# Patient Record
Sex: Male | Born: 1970 | Hispanic: Yes | Marital: Single | Smoking: Current every day smoker
Health system: Southern US, Community
[De-identification: ages and names within clinical notes are randomized; demographics above are authoritative.]

## PROBLEM LIST (undated history)

## (undated) DIAGNOSIS — F1010 Alcohol abuse, uncomplicated: Secondary | ICD-10-CM

---

## 2013-01-21 ENCOUNTER — Emergency Department (HOSPITAL_COMMUNITY): Payer: Self-pay

## 2013-01-21 ENCOUNTER — Emergency Department (HOSPITAL_COMMUNITY)
Admission: EM | Admit: 2013-01-21 | Discharge: 2013-01-22 | Disposition: A | Discharge status: Home / Self Care | Payer: Self-pay | Attending: Emergency Medicine | Admitting: Emergency Medicine

## 2013-01-21 ENCOUNTER — Encounter (HOSPITAL_COMMUNITY): Payer: Self-pay | Admitting: Emergency Medicine

## 2013-01-21 DIAGNOSIS — F1010 Alcohol abuse, uncomplicated: Secondary | ICD-10-CM | POA: Insufficient documentation

## 2013-01-21 DIAGNOSIS — R0602 Shortness of breath: Secondary | ICD-10-CM | POA: Insufficient documentation

## 2013-01-21 DIAGNOSIS — R112 Nausea with vomiting, unspecified: Secondary | ICD-10-CM | POA: Insufficient documentation

## 2013-01-21 DIAGNOSIS — K2990 Gastroduodenitis, unspecified, without bleeding: Secondary | ICD-10-CM | POA: Insufficient documentation

## 2013-01-21 DIAGNOSIS — K2970 Gastritis, unspecified, without bleeding: Secondary | ICD-10-CM | POA: Insufficient documentation

## 2013-01-21 DIAGNOSIS — R109 Unspecified abdominal pain: Secondary | ICD-10-CM | POA: Insufficient documentation

## 2013-01-21 HISTORY — DX: Alcohol abuse, uncomplicated: F10.10

## 2013-01-21 LAB — CBC WITH DIFFERENTIAL/PLATELET
Basophils Absolute: 0.1 10*3/uL (ref 0.0–0.1)
Basophils Relative: 1 % (ref 0–1)
Eosinophils Absolute: 0 10*3/uL (ref 0.0–0.7)
Hemoglobin: 17.1 g/dL — ABNORMAL HIGH (ref 13.0–17.0)
MCH: 28.8 pg (ref 26.0–34.0)
MCHC: 35.9 g/dL (ref 30.0–36.0)
Monocytes Relative: 7 % (ref 3–12)
Neutrophils Relative %: 64 % (ref 43–77)
Platelets: 216 10*3/uL (ref 150–400)
RDW: 13.5 % (ref 11.5–15.5)

## 2013-01-21 LAB — COMPREHENSIVE METABOLIC PANEL
Albumin: 3.7 g/dL (ref 3.5–5.2)
Alkaline Phosphatase: 70 U/L (ref 39–117)
BUN: 15 mg/dL (ref 6–23)
Creatinine, Ser: 0.89 mg/dL (ref 0.50–1.35)
Potassium: 4 mEq/L (ref 3.5–5.1)
Total Protein: 7.3 g/dL (ref 6.0–8.3)

## 2013-01-21 LAB — LIPASE, BLOOD: Lipase: 23 U/L (ref 11–59)

## 2013-01-21 LAB — RAPID URINE DRUG SCREEN, HOSP PERFORMED
Benzodiazepines: NOT DETECTED
Cocaine: NOT DETECTED

## 2013-01-21 LAB — ETHANOL: Alcohol, Ethyl (B): 192 mg/dL — ABNORMAL HIGH (ref 0–11)

## 2013-01-21 MED ORDER — FOLIC ACID 1 MG PO TABS
1.0000 mg | ORAL_TABLET | Freq: Every day | ORAL | Status: DC
  Administered 2013-01-21 – 2013-01-22 (×2): 1 mg via ORAL
  Filled 2013-01-21 (×2): qty 1

## 2013-01-21 MED ORDER — VITAMIN B-1 100 MG PO TABS
100.0000 mg | ORAL_TABLET | Freq: Every day | ORAL | Status: DC
  Administered 2013-01-22: 100 mg via ORAL
  Filled 2013-01-21: qty 1

## 2013-01-21 MED ORDER — ONDANSETRON 4 MG PO TBDP
8.0000 mg | ORAL_TABLET | Freq: Once | ORAL | Status: AC
  Administered 2013-01-21: 8 mg via ORAL
  Filled 2013-01-21: qty 2

## 2013-01-21 MED ORDER — LORAZEPAM 1 MG PO TABS
1.0000 mg | ORAL_TABLET | Freq: Once | ORAL | Status: AC
  Administered 2013-01-21: 1 mg via ORAL
  Filled 2013-01-21: qty 1

## 2013-01-21 MED ORDER — ONDANSETRON HCL 4 MG PO TABS
4.0000 mg | ORAL_TABLET | Freq: Three times a day (TID) | ORAL | Status: DC | PRN
  Administered 2013-01-22: 4 mg via ORAL
  Filled 2013-01-21: qty 1

## 2013-01-21 MED ORDER — LORAZEPAM 2 MG/ML IJ SOLN: 1.0000 mg | Freq: Four times a day (QID) | INTRAMUSCULAR | Status: DC | PRN

## 2013-01-21 MED ORDER — ADULT MULTIVITAMIN W/MINERALS CH
1.0000 | ORAL_TABLET | Freq: Every day | ORAL | Status: DC
  Administered 2013-01-22: 1 via ORAL
  Filled 2013-01-21: qty 1

## 2013-01-21 MED ORDER — ZOLPIDEM TARTRATE 5 MG PO TABS
5.0000 mg | ORAL_TABLET | Freq: Every evening | ORAL | Status: DC | PRN
  Administered 2013-01-21: 5 mg via ORAL
  Filled 2013-01-21: qty 1

## 2013-01-21 MED ORDER — LORAZEPAM 1 MG PO TABS: 0.0000 mg | ORAL_TABLET | Freq: Two times a day (BID) | ORAL | Status: DC

## 2013-01-21 MED ORDER — IBUPROFEN 600 MG PO TABS: 600.0000 mg | ORAL_TABLET | Freq: Three times a day (TID) | ORAL | Status: DC | PRN

## 2013-01-21 MED ORDER — LORAZEPAM 1 MG PO TABS
0.0000 mg | ORAL_TABLET | Freq: Four times a day (QID) | ORAL | Status: DC
  Administered 2013-01-21: 2 mg via ORAL
  Filled 2013-01-21: qty 1
  Filled 2013-01-21: qty 2

## 2013-01-21 MED ORDER — THIAMINE HCL 100 MG/ML IJ SOLN: 100.0000 mg | Freq: Every day | INTRAMUSCULAR | Status: DC

## 2013-01-21 MED ORDER — LORAZEPAM 1 MG PO TABS
1.0000 mg | ORAL_TABLET | Freq: Four times a day (QID) | ORAL | Status: DC | PRN
  Administered 2013-01-22: 1 mg via ORAL

## 2013-01-21 NOTE — ED Notes (Signed)
Pt received from the ED 1715. Meds scheduled for 1530 were not given in main ED. Reason unknown.

## 2013-01-21 NOTE — ED Provider Notes (Signed)
History    This chart was scribed for non-physician practitioner Lottie Mussel, PA-C working with Richardean Canal, MD by Toya Smothers, ED Scribe. This patient was seen in room [facility]TR1/[facility]PT1 and the patient's care was started at 4:12 PM.   CSN: [id number]  Arrival date & time 01/21/13  1426   First MD Initiated Contact with Patient 01/21/13 1453      Chief Complaint  Patient presents with  . Medical Clearance    The history is provided by the patient. A language interpreter was used (Spanish interpreter).    HPI Comments:  Willie Boyle is a 42 y.o. male with a h/o alcohol abuse, who presents to the Emergency Department for medical clearance, stating that he wants to enrolled a detox program. He reports a h/o social drinking, though for the past week he has had increased use. Pt states that he drinks liquor and wine, typically one bottle a day. Pt last consumed 2 glasses of wine this morning. He reports no prior treatment or hospitalization. Pt now c/o unchanged generalized lower abdominal pain, mild SOB, and emesis this morning. Symptoms have not been treated PTA. Pt denies headache, diaphoresis, fever, chills, nausea, vomiting, diarrhea, weakness, cough, SOB and any other pain. Pt denies use of tobacco and illicit drug use.   Past Medical History  Diagnosis Date  . Alcohol abuse     History reviewed. No pertinent past surgical history.  No family history on file.  History  Substance Use Topics  . Smoking status: Not on file  . Smokeless tobacco: Not on file  . Alcohol Use: Not on file      Review of Systems  Constitutional: Negative for fever and chills.  HENT: Negative for rhinorrhea and neck pain.   Eyes: Negative for pain.  Respiratory: Positive for shortness of breath. Negative for cough.   Cardiovascular: Negative for chest pain.  Gastrointestinal: Positive for nausea, vomiting and abdominal pain. Negative for diarrhea.  Genitourinary: Negative for dysuria.   Musculoskeletal: Negative for back pain.  Skin: Negative for rash.  Neurological: Negative for dizziness and weakness.    Allergies  Review of patient's allergies indicates no known allergies.  Home Medications  No current outpatient prescriptions on file.  BP 130/90  Pulse 106  Temp(Src) 99.8 F (37.7 C) (Oral)  Resp 18  SpO2 97%  Physical Exam  Nursing note and vitals reviewed. Constitutional: He is oriented to person, place, and time. He appears well-developed and well-nourished. No distress.  HENT:  Head: Normocephalic and atraumatic.  Eyes: EOM are normal. Pupils are equal, round, and reactive to light.  Neck: Neck supple. No tracheal deviation present.  Cardiovascular: Normal rate, regular rhythm and normal heart sounds.  Exam reveals no gallop and no friction rub.   No murmur heard. Pulmonary/Chest: Effort normal. No respiratory distress.  Abdominal: Soft. He exhibits no distension.  Diffusely tender  Musculoskeletal: Normal range of motion. He exhibits no edema.  Neurological: He is alert and oriented to person, place, and time. No sensory deficit.  Skin: Skin is warm and dry.  Psychiatric: He has a normal mood and affect. His behavior is normal.    ED Course  Procedures DIAGNOSTIC STUDIES: Oxygen Saturation is 97% on room air, normal by my interpretation.    COORDINATION OF CARE: 15:16- Evaluated Pt. Pt is awake, alert, and without distress. 15:22- Patient understand and agree with initial ED impression and plan with expectations set for ED visit. 15:28- Ordered DG Chest 2 View 1  time imaging and Drug screen panel, emergency STAT.    Results for orders placed during the hospital encounter of 01/21/13  CBC WITH DIFFERENTIAL      Result Value Range   WBC 4.4  4.0 - 10.5 K/uL   RBC 5.93 (*) 4.22 - 5.81 MIL/uL   Hemoglobin 17.1 (*) 13.0 - 17.0 g/dL   HCT 78.2  95.6 - 21.3 %   MCV 80.3  78.0 - 100.0 fL   MCH 28.8  26.0 - 34.0 pg   MCHC 35.9  30.0 - 36.0  g/dL   RDW 08.6  57.8 - 46.9 %   Platelets 216  150 - 400 K/uL   Neutrophils Relative % 64  43 - 77 %   Neutro Abs 2.8  1.7 - 7.7 K/uL   Lymphocytes Relative 28  12 - 46 %   Lymphs Abs 1.2  0.7 - 4.0 K/uL   Monocytes Relative 7  3 - 12 %   Monocytes Absolute 0.3  0.1 - 1.0 K/uL   Eosinophils Relative 0  0 - 5 %   Eosinophils Absolute 0.0  0.0 - 0.7 K/uL   Basophils Relative 1  0 - 1 %   Basophils Absolute 0.1  0.0 - 0.1 K/uL  COMPREHENSIVE METABOLIC PANEL      Result Value Range   Sodium 137  135 - 145 mEq/L   Potassium 4.0  3.5 - 5.1 mEq/L   Chloride 99  96 - 112 mEq/L   CO2 24  19 - 32 mEq/L   Glucose, Bld 117 (*) 70 - 99 mg/dL   BUN 15  6 - 23 mg/dL   Creatinine, Ser 6.29  0.50 - 1.35 mg/dL   Calcium 8.7  8.4 - 52.8 mg/dL   Total Protein 7.3  6.0 - 8.3 g/dL   Albumin 3.7  3.5 - 5.2 g/dL   AST 27  0 - 37 U/L   ALT 22  0 - 53 U/L   Alkaline Phosphatase 70  39 - 117 U/L   Total Bilirubin 0.9  0.3 - 1.2 mg/dL   GFR calc non Af Amer >90  >90 mL/min   GFR calc Af Amer >90  >90 mL/min  LIPASE, BLOOD      Result Value Range   Lipase 23  11 - 59 U/L  ETHANOL      Result Value Range   Alcohol, Ethyl (B) 192 (*) 0 - 11 mg/dL  URINE RAPID DRUG SCREEN (HOSP PERFORMED)      Result Value Range   Opiates NONE DETECTED  NONE DETECTED   Cocaine NONE DETECTED  NONE DETECTED   Benzodiazepines NONE DETECTED  NONE DETECTED   Amphetamines NONE DETECTED  NONE DETECTED   Tetrahydrocannabinol NONE DETECTED  NONE DETECTED   Barbiturates NONE DETECTED  NONE DETECTED  POCT I-STAT TROPONIN I      Result Value Range   Troponin i, poc 0.01  0.00 - 0.08 ng/mL   Comment 3            Dg Chest 2 View  01/21/2013   *RADIOLOGY REPORT*  Clinical Data: For medical clearance  CHEST - 2 VIEW  Comparison: None.  Findings: No active infiltrate or effusion is seen.  Mediastinal contours appear normal.  The heart is within normal limits in size. No bony abnormality is seen.  IMPRESSION: No active lung  disease.   Original Report Authenticated By: Dwyane Dee, M.D.      1. Alcohol abuse  2. Nausea & vomiting   3. Gastritis       MDM  Pt with alcohol abuse, requesting inpatient detox. Also here with abdominal discomfort, nausea, vomiting at home. Abdomen is soft, no acute abdomen on exam. Labs unremarkable. Tolerating PO fluids in ED. Pt has no known medical problems. VS normal in ED. Will place on CIWA protocol and spoke with ACT, they will assess.   Filed Vitals:   01/21/13 1430 01/21/13 1702 01/21/13 1756  BP: 130/90 137/92 145/85  Pulse: 106 90 64  Temp: 99.8 F (37.7 C) 99.6 F (37.6 C) 98.2 F (36.8 C)  TempSrc: Oral Oral Oral  Resp: 18  20  SpO2: 97% 98% 99%     I personally performed the services described in this documentation, which was scribed in my presence. The recorded information has been reviewed and is accurate.    Lottie Mussel, PA-C 01/22/13 0124

## 2013-01-21 NOTE — ED Notes (Signed)
Pt brought to the Psych ED. States that he has been drinking for 25 years. States that he drinks beer but could not determine how much due to a language barrier. States he is feeling nervous. Pt is homeless. Originally from [place].

## 2013-01-21 NOTE — ED Notes (Signed)
Per EMS pt called and reports he is homeless and reports he has been an alcoholic for years. Denies drug use. Wants Detox. Pt positive for N/V. Pt voluntary and cooperative. Pt speaks spanish.

## 2013-01-21 NOTE — ED Notes (Signed)
After calling the interpertation line pt states that he drinks liquor approx 1 bottle a day. C/o n/v and abd pain. Not very clear in what he needs did states that he wants to have detox.

## 2013-01-22 NOTE — BH Assessment (Signed)
Assessment Note   Willie Boyle is an 42 y.o. male.   Pt presents in ED with request for alcohol detox or optx.  Pt consumes 12pk or more for 5 day binges every 2 wks or so.  Pt could not identify rational for use except "this is what I have always done."  Pt denied SI, HI, AVH.  Pt denied being on medications or past tx history.  Pt is spanish speaking and interpretor services were used.    MSE by ACT:  Pt appeared dishelved, good eye contact, Ox3, speech clear, pt was rubbing belly region bu when asked pt responded "I have the hiccups."  Pt facial experessions were in appropriate range.  Prior to concluding, Assessor asked interpretor is she picked up on any issues needing to be addressed that Assessor did not due to language barrier and she responded "No.  His answers were in line with appropriate responses."    Pt decided in the assessment he wanted optx due to not being able to miss work on Monday.  Pt appear ambilvent about inpatient detox.  Assessor explained the benefits of inpatient detox and pt opted for optx.  Pt denied having seizure hx, and did not appear to be going through any withdraws at this time.  Pt demonstrated steady gait and was up walking around when Assessor left his room.  Pt went to the bathroom on his own.  Will refer to [facility], [facility] and [facility] for follow up.  Axis I: Alcohol Abuse Axis II: Deferred Axis III:  Past Medical History  Diagnosis Date  . Alcohol abuse    Axis IV: other psychosocial or environmental problems and problems related to social environment Axis V: 51-60 moderate symptoms  Past Medical History:  Past Medical History  Diagnosis Date  . Alcohol abuse     History reviewed. No pertinent past surgical history.  Family History: No family history on file.  Social History:  has no tobacco, alcohol, and drug history on file.  Additional Social History:  Alcohol / Drug Use Pain Medications: na Prescriptions: na Over the  Counter: na History of alcohol / drug use?: Yes Longest period of sobriety (when/how long): 1 yr Negative Consequences of Use: Financial;Personal relationships Substance #1 Name of Substance 1: alcohol 1 - Age of First Use: teen 1 - Amount (size/oz): 12pk to case - depends 1 - Frequency: daily 1 - Duration: 5 to 8 days binge; 2 wks off; cycle repeats 1 - Last Use / Amount: 01-22-13  CIWA: CIWA-Ar BP: 138/93 mmHg Pulse Rate: 81 Nausea and Vomiting: mild nausea with no vomiting Tactile Disturbances: none Tremor: not visible, but can be felt fingertip to fingertip Auditory Disturbances: very mild harshness or ability to frighten Paroxysmal Sweats: barely perceptible sweating, palms moist Visual Disturbances: not present Anxiety: two Headache, Fullness in Head: none present Agitation: normal activity Orientation and Clouding of Sensorium: oriented and can do serial additions CIWA-Ar Total: 6 COWS:    Allergies: No Known Allergies  Home Medications:  (Not in a hospital admission)  OB/GYN Status:  No LMP for male patient.  General Assessment Data Location of Assessment: [facility] Living Arrangements: Other (Comment) (lives in [place] with others ) Can pt return to current living arrangement?: Yes Admission Status: Voluntary Is patient capable of signing voluntary admission?: Yes Transfer from: Acute Hospital Referral Source: MD  Education Status Is patient currently in school?: No  Risk to self Suicidal Ideation: No Suicidal Intent: No Is patient at risk  for suicide?: No Suicidal Plan?: No Access to Means: No What has been your use of drugs/alcohol within the last 12 months?: na Previous Attempts/Gestures: No How many times?: 0 Other Self Harm Risks: na Triggers for Past Attempts: None known Intentional Self Injurious Behavior: None Family Suicide History: No Recent stressful life event(s):  (sa issues) Persecutory voices/beliefs?: No Depression: No Substance abuse  history and/or treatment for substance abuse?: Yes (Has never had treatment. Does not smoke or use legal drugs) Suicide prevention information given to non-admitted patients: Yes  Risk to Others Homicidal Ideation: No-Not Currently/Within Last 6 Months Thoughts of Harm to Others: No Current Homicidal Intent: No Current Homicidal Plan: No Access to Homicidal Means: No Identified Victim: na History of harm to others?: No Assessment of Violence: None Noted Violent Behavior Description: cooperative Does patient have access to weapons?: No Criminal Charges Pending?: No Does patient have a court date: No  Psychosis Hallucinations: None noted Delusions: None noted  Mental Status Report Appear/Hygiene: Disheveled Eye Contact: Good Motor Activity: Freedom of movement Speech: Logical/coherent Level of Consciousness: Alert Mood: Ambivalent (unsure if he wanted detox or optx) Affect: Appropriate to circumstance Anxiety Level: None Thought Processes: Coherent Judgement: Unimpaired Orientation: Person;Place;Situation;Appropriate for developmental age Obsessive Compulsive Thoughts/Behaviors: None  Cognitive Functioning Concentration: Normal Memory: Recent Intact;Remote Intact IQ: Average Insight: Fair Impulse Control: Poor Appetite: Fair Weight Loss: 0 Weight Gain: 0 Sleep: No Change Total Hours of Sleep: 6 Vegetative Symptoms: None  ADLScreening [facility] Assessment Services) Patient's cognitive ability adequate to safely complete daily activities?: Yes Patient able to express need for assistance with ADLs?: Yes Independently performs ADLs?: Yes (appropriate for developmental age)  Abuse/Neglect [facility]) Physical Abuse: Denies Verbal Abuse: Denies Sexual Abuse: Denies  Prior Inpatient Therapy Prior Inpatient Therapy: No Prior Therapy Dates: na Prior Therapy Facilty/Provider(s): na Reason for Treatment: na  Prior Outpatient Therapy Prior Outpatient Therapy: No Prior Therapy  Dates: na Prior Therapy Facilty/Provider(s): na Reason for Treatment: na  ADL Screening (condition at time of admission) Patient's cognitive ability adequate to safely complete daily activities?: Yes Patient able to express need for assistance with ADLs?: Yes Independently performs ADLs?: Yes (appropriate for developmental age) Weakness of Legs: None Weakness of Arms/Hands: None  Home Assistive Devices/Equipment Home Assistive Devices/Equipment: None  Therapy Consults (therapy consults require a physician order) PT Evaluation Needed: No OT Evalulation Needed: No SLP Evaluation Needed: No Abuse/Neglect Assessment (Assessment to be complete while patient is alone) Physical Abuse: Denies Verbal Abuse: Denies Sexual Abuse: Denies Exploitation of patient/patient's resources: Denies Self-Neglect: Denies Values / Beliefs Cultural Requests During Hospitalization: None Spiritual Requests During Hospitalization: None Consults Spiritual Care Consult Needed: No Social Work Consult Needed: No Merchant navy officer (For Healthcare) Advance Directive: Patient does not have advance directive Pre-existing out of facility DNR order (yellow form or pink MOST form): No    Additional Information 1:1 In Past 12 Months?: No CIRT Risk: No Elopement Risk: No Does patient have medical clearance?: Yes     Disposition:   Disposition Initial Assessment Completed for this Encounter: Yes Disposition of Patient: Referred to;Outpatient treatment ([facility]; [facility] services and [facility]) Type of outpatient treatment: [facility] - Intensive Outpatient;Adult Patient referred to: Outpatient clinic referral ([facility]; [facility])  On Site Evaluation by:   Reviewed with Physician:     Macon Large 01/22/2013 10:05 AM

## 2013-01-22 NOTE — ED Notes (Signed)
Assessment completed with help of interpreter.

## 2013-01-22 NOTE — ED Notes (Signed)
D/C instructions given along with a bus pass. Escorted with mental health tech to front of hospital. Belongings returned. Ambulatory without difficulty, no complaint of pain.

## 2013-01-22 NOTE — ED Provider Notes (Signed)
Pt stable awaiting placement  Willie Boyle L [name], MD 01/22/13 0723 

## 2013-01-22 NOTE — ED Provider Notes (Signed)
Medical screening examination/treatment/procedure(s) were performed by non-physician practitioner and as supervising physician I was immediately available for consultation/collaboration.   Nikelle Malatesta H Kynsleigh Westendorf, MD 01/22/13 1511 

## 2013-11-09 ENCOUNTER — Emergency Department (HOSPITAL_COMMUNITY)
Admission: EM | Admit: 2013-11-09 | Discharge: 2013-11-09 | Disposition: A | Discharge status: Other Acute Inpatient Hospital | Payer: Self-pay | Attending: Emergency Medicine | Admitting: Emergency Medicine

## 2013-11-09 ENCOUNTER — Encounter (HOSPITAL_COMMUNITY): Payer: Self-pay | Admitting: Emergency Medicine

## 2013-11-09 ENCOUNTER — Emergency Department (HOSPITAL_COMMUNITY): Payer: Self-pay

## 2013-11-09 ENCOUNTER — Inpatient Hospital Stay (HOSPITAL_COMMUNITY): Admission: AD | Admit: 2013-11-09 | Payer: Self-pay | Source: Intra-hospital | Admitting: Psychiatry

## 2013-11-09 DIAGNOSIS — F1010 Alcohol abuse, uncomplicated: Secondary | ICD-10-CM | POA: Diagnosis present

## 2013-11-09 DIAGNOSIS — R1013 Epigastric pain: Secondary | ICD-10-CM | POA: Insufficient documentation

## 2013-11-09 DIAGNOSIS — R197 Diarrhea, unspecified: Secondary | ICD-10-CM | POA: Insufficient documentation

## 2013-11-09 DIAGNOSIS — F10239 Alcohol dependence with withdrawal, unspecified: Secondary | ICD-10-CM | POA: Diagnosis present

## 2013-11-09 DIAGNOSIS — R109 Unspecified abdominal pain: Secondary | ICD-10-CM

## 2013-11-09 DIAGNOSIS — F17200 Nicotine dependence, unspecified, uncomplicated: Secondary | ICD-10-CM | POA: Insufficient documentation

## 2013-11-09 DIAGNOSIS — F10939 Alcohol use, unspecified with withdrawal, unspecified: Secondary | ICD-10-CM

## 2013-11-09 DIAGNOSIS — R112 Nausea with vomiting, unspecified: Secondary | ICD-10-CM | POA: Insufficient documentation

## 2013-11-09 LAB — CBC WITH DIFFERENTIAL/PLATELET
BASOS ABS: 0.1 10*3/uL (ref 0.0–0.1)
BASOS PCT: 1 % (ref 0–1)
EOS PCT: 0 % (ref 0–5)
Eosinophils Absolute: 0 10*3/uL (ref 0.0–0.7)
HEMATOCRIT: 45.1 % (ref 39.0–52.0)
Hemoglobin: 16.3 g/dL (ref 13.0–17.0)
Lymphocytes Relative: 27 % (ref 12–46)
Lymphs Abs: 1.6 10*3/uL (ref 0.7–4.0)
MCH: 28.9 pg (ref 26.0–34.0)
MCHC: 36.1 g/dL — AB (ref 30.0–36.0)
MCV: 80 fL (ref 78.0–100.0)
MONO ABS: 0.4 10*3/uL (ref 0.1–1.0)
Monocytes Relative: 7 % (ref 3–12)
Neutro Abs: 3.9 10*3/uL (ref 1.7–7.7)
Neutrophils Relative %: 66 % (ref 43–77)
Platelets: 222 10*3/uL (ref 150–400)
RBC: 5.64 MIL/uL (ref 4.22–5.81)
RDW: 13.3 % (ref 11.5–15.5)
WBC: 5.9 10*3/uL (ref 4.0–10.5)

## 2013-11-09 LAB — URINALYSIS, ROUTINE W REFLEX MICROSCOPIC
BILIRUBIN URINE: NEGATIVE
Glucose, UA: NEGATIVE mg/dL
Hgb urine dipstick: NEGATIVE
Ketones, ur: NEGATIVE mg/dL
Leukocytes, UA: NEGATIVE
NITRITE: NEGATIVE
PROTEIN: NEGATIVE mg/dL
SPECIFIC GRAVITY, URINE: 1.011 (ref 1.005–1.030)
UROBILINOGEN UA: 0.2 mg/dL (ref 0.0–1.0)
pH: 7.5 (ref 5.0–8.0)

## 2013-11-09 LAB — RAPID URINE DRUG SCREEN, HOSP PERFORMED
AMPHETAMINES: NOT DETECTED
BARBITURATES: NOT DETECTED
BENZODIAZEPINES: NOT DETECTED
Cocaine: NOT DETECTED
Opiates: NOT DETECTED
TETRAHYDROCANNABINOL: NOT DETECTED

## 2013-11-09 LAB — COMPREHENSIVE METABOLIC PANEL
ALBUMIN: 3.7 g/dL (ref 3.5–5.2)
ALT: 22 U/L (ref 0–53)
AST: 42 U/L — AB (ref 0–37)
Alkaline Phosphatase: 70 U/L (ref 39–117)
BUN: 13 mg/dL (ref 6–23)
CALCIUM: 8.9 mg/dL (ref 8.4–10.5)
CO2: 22 mEq/L (ref 19–32)
CREATININE: 0.86 mg/dL (ref 0.50–1.35)
Chloride: 94 mEq/L — ABNORMAL LOW (ref 96–112)
GFR calc Af Amer: >90 mL/min (ref >90)
Glucose, Bld: 114 mg/dL — ABNORMAL HIGH (ref 70–99)
Potassium: 4.3 mEq/L (ref 3.7–5.3)
Sodium: 136 mEq/L — ABNORMAL LOW (ref 137–147)
Total Bilirubin: 0.9 mg/dL (ref 0.3–1.2)
Total Protein: 7.2 g/dL (ref 6.0–8.3)

## 2013-11-09 LAB — POC OCCULT BLOOD, ED: Fecal Occult Bld: NEGATIVE

## 2013-11-09 LAB — LIPASE, BLOOD: LIPASE: 24 U/L (ref 11–59)

## 2013-11-09 LAB — PROTIME-INR
INR: 0.9 (ref 0.00–1.49)
PROTHROMBIN TIME: 12 s (ref 11.6–15.2)

## 2013-11-09 LAB — ETHANOL: ALCOHOL ETHYL (B): 102 mg/dL — AB (ref 0–11)

## 2013-11-09 MED ORDER — CHLORDIAZEPOXIDE HCL 25 MG PO CAPS
25.0000 mg | ORAL_CAPSULE | Freq: Once | ORAL | Status: DC
  Filled 2013-11-09: qty 1

## 2013-11-09 MED ORDER — CHLORDIAZEPOXIDE HCL 25 MG PO CAPS: 25.0000 mg | ORAL_CAPSULE | Freq: Three times a day (TID) | ORAL | Status: DC

## 2013-11-09 MED ORDER — CHLORDIAZEPOXIDE HCL 25 MG PO CAPS: 25.0000 mg | ORAL_CAPSULE | ORAL | Status: DC

## 2013-11-09 MED ORDER — ONDANSETRON 4 MG PO TBDP: 4.0000 mg | ORAL_TABLET | Freq: Four times a day (QID) | ORAL | Status: DC | PRN

## 2013-11-09 MED ORDER — CHLORDIAZEPOXIDE HCL 25 MG PO CAPS
25.0000 mg | ORAL_CAPSULE | Freq: Every day | ORAL | Status: DC
Start: 2013-11-12 — End: 2013-11-09

## 2013-11-09 MED ORDER — CHLORDIAZEPOXIDE HCL 25 MG PO CAPS: 25.0000 mg | ORAL_CAPSULE | Freq: Four times a day (QID) | ORAL | Status: DC

## 2013-11-09 MED ORDER — LOPERAMIDE HCL 2 MG PO CAPS: 2.0000 mg | ORAL_CAPSULE | ORAL | Status: DC | PRN

## 2013-11-09 MED ORDER — CHLORDIAZEPOXIDE HCL 25 MG PO CAPS: 25.0000 mg | ORAL_CAPSULE | Freq: Four times a day (QID) | ORAL | Status: DC | PRN

## 2013-11-09 MED ORDER — ONDANSETRON HCL 4 MG/2ML IJ SOLN
4.0000 mg | Freq: Once | INTRAMUSCULAR | Status: AC
  Administered 2013-11-09: 4 mg via INTRAVENOUS
  Filled 2013-11-09: qty 2

## 2013-11-09 MED ORDER — PANTOPRAZOLE SODIUM 40 MG IV SOLR
40.0000 mg | Freq: Once | INTRAVENOUS | Status: AC
  Administered 2013-11-09: 40 mg via INTRAVENOUS
  Filled 2013-11-09: qty 40

## 2013-11-09 MED ORDER — SODIUM CHLORIDE 0.9 % IV BOLUS (SEPSIS)
1000.0000 mL | Freq: Once | INTRAVENOUS | Status: AC
  Administered 2013-11-09: 1000 mL via INTRAVENOUS

## 2013-11-09 MED ORDER — THIAMINE HCL 100 MG/ML IJ SOLN: 100.0000 mg | Freq: Once | INTRAMUSCULAR | Status: DC

## 2013-11-09 MED ORDER — HYDROXYZINE HCL 25 MG PO TABS
25.0000 mg | ORAL_TABLET | Freq: Four times a day (QID) | ORAL | Status: DC | PRN
  Filled 2013-11-09: qty 1

## 2013-11-09 MED ORDER — VITAMIN B-1 100 MG PO TABS
100.0000 mg | ORAL_TABLET | Freq: Every day | ORAL | Status: DC
  Filled 2013-11-09: qty 1

## 2013-11-09 MED ORDER — ADULT MULTIVITAMIN W/MINERALS CH: 1.0000 | ORAL_TABLET | Freq: Every day | ORAL | Status: DC

## 2013-11-09 NOTE — Progress Notes (Deleted)
[facility] provided pt with a list of primary care resources to help patient establish primary care.  °

## 2013-11-09 NOTE — Progress Notes (Signed)
[facility] spoke with patient via interpretor phone and provided him with a [employer] application in [place], highlighting [facility] to help patient establish primary care.

## 2013-11-09 NOTE — Consult Note (Signed)
  Patient drinks on a daily basis.  States that he drinks 12 to 14 beers a day or greater.  Patient has had increase in alcohol intake within this last week and is seeking alcohol detox.    Agree with the TTS assessment for inpatient detox.  [facility]  Will start Librium protocol.  Monitor for safety and stabilization until transferred  [facility] B[facility] Rankin FNP-BC

## 2013-11-09 NOTE — ED Notes (Signed)
Per ems pt c/o alcohol intake this morning; c/o abd pain; vomiting en route

## 2013-11-09 NOTE — ED Notes (Signed)
Interpreter phones at bedside with patient, Dr. Criss AlvineGoldston, and myself. Patient reports he's been drinking 4 to 5 beers a day for the last 6 days and is requesting detox. Patient reports generalized abdominal pain with the worst pain present to to umbilical/mid upper abdominal region.

## 2013-11-09 NOTE — ED Provider Notes (Signed)
CSN: [id number]     Arrival date & time 11/09/13  1153 History   First MD Initiated Contact with Patient 11/09/13 1159     Chief Complaint  Patient presents with  . Abdominal Pain     (Consider location/radiation/quality/duration/timing/severity/associated sxs/prior Treatment) HPI Comments: 43 year old male presents with abdominal pain and wanting detox. The history is taken through the Spanish interpreter phone. Patient states last 3-4 days been having epigastric abdominal pain, nausea, vomiting, and diarrhea. He states today's had some blood in his emesis. He endorses some black stools as well. He's been drinking "a lot" of beer over last 4-5 days. He's had problems with alcohol in the past but is never been through detox. At this time he is wanting detox. He describes is epigastric abdominal pain as a "burning". He is not able to give me a number on the pain scale. He does not know if he has a liver problems.   Past Medical History  Diagnosis Date  . Alcohol abuse    History reviewed. No pertinent past surgical history. No family history on file. History  Substance Use Topics  . Smoking status: Current Every Day Smoker  . Smokeless tobacco: Not on file  . Alcohol Use: Yes    Review of Systems  Constitutional: Negative for fever.  Gastrointestinal: Positive for nausea, vomiting, abdominal pain and diarrhea.  Musculoskeletal: Negative for back pain.  All other systems reviewed and are negative.      Allergies  Review of patient's allergies indicates no known allergies.  Home Medications  No current outpatient prescriptions on file. BP 146/92  Pulse 97  Temp(Src) 98.4 F (36.9 C) (Oral)  Resp 18  SpO2 97% Physical Exam  Nursing note and vitals reviewed. Constitutional: He is oriented to person, place, and time. He appears well-developed and well-nourished. No distress.  HENT:  Head: Normocephalic and atraumatic.  Right Ear: External ear normal.  Left Ear: External  ear normal.  Nose: Nose normal.  Eyes: Right eye exhibits no discharge. Left eye exhibits no discharge.  Neck: Neck supple.  Cardiovascular: Normal rate, regular rhythm, normal heart sounds and intact distal pulses.   Pulmonary/Chest: Effort normal.  Abdominal: Soft. He exhibits no distension. There is tenderness (mild) in the epigastric area. There is no rigidity, no guarding and negative Murphy's sign.  Genitourinary: Rectal exam shows no external hemorrhoid and no internal hemorrhoid. Guaiac negative stool.  No stool on rectal exam  Musculoskeletal: He exhibits no edema.  Neurological: He is alert and oriented to person, place, and time.  Skin: Skin is warm and dry.    ED Course  Procedures (including critical care time) Labs Review Labs Reviewed  CBC WITH DIFFERENTIAL - Abnormal; Notable for the following:    MCHC 36.1 (*)    All other components within normal limits  COMPREHENSIVE METABOLIC PANEL - Abnormal; Notable for the following:    Sodium 136 (*)    Chloride 94 (*)    Glucose, Bld 114 (*)    AST 42 (*)    All other components within normal limits  ETHANOL - Abnormal; Notable for the following:    Alcohol, Ethyl (B) 102 (*)    All other components within normal limits  LIPASE, BLOOD  PROTIME-INR  URINALYSIS, ROUTINE W REFLEX MICROSCOPIC  URINE RAPID DRUG SCREEN (HOSP PERFORMED)  POC OCCULT BLOOD, ED   Imaging Review Dg Abd Acute W/chest  11/09/2013   CLINICAL DATA:  Abdominal pain, hematemesis  EXAM: ACUTE ABDOMEN SERIES (ABDOMEN 2  VIEW & CHEST 1 VIEW)  COMPARISON:  01/21/2013 chest radiographs  FINDINGS: Normal heart size, mediastinal contours, and pulmonary vascularity.  Mild peribronchial thickening.  No pulmonary infiltrate, pleural effusion, or pneumothorax.  Bones unremarkable.  Normal bowel gas pattern.  No bowel dilatation or bowel wall thickening, or free intraperitoneal air.  No urinary tract or intra-abdominal calcification.  IMPRESSION: No acute abdominal  findings.  Mild chronic bronchitic changes.   Electronically Signed   By: Ulyses SouthwardMark  Boles M.D.   On: 11/09/2013 13:14     EKG Interpretation None      MDM   Final diagnoses:  Abdominal pain  Alcohol abuse    Patient's abdominal exam is benign. His rectal exam shows no blood. He states he has vomited blood multiple times, however patient not had any episodes of vomiting while here. He was given Protonix IV. His primary complaint is detox. As he is well appearing and has benign labs I feel he is medically cleared at this time. His hemoglobin is normal at 16. Will refer to TTS for detox.    Audree CamelScott T Other Atienza, MD 11/09/13 [phone]

## 2013-11-09 NOTE — Discharge Instructions (Signed)
Intoxicacin alcohlica (Alcohol Intoxication) La intoxicacin alcohlica ocurre cuando ha bebido la cantidad de alcohol suficiente para afectar su desenvolvimiento. Puede ser leve o muy grave. Beber gran cantidad de alcohol en un corto plazo se denomina borrachera. Puede ser [employer]. Beber alcohol tambin puede ser muy peligroso si toma medicamentos o [place] otras drogas. Algunos de los efectos causados por el alcohol son:  Prdida de la coordinacin.  Cambios en el estado de nimo y la conducta.  Pensamiento confuso.  Dificultad para hablar (arrastrar las palabras).  Devolver la comida (vomitar).  Confusin.  Disminucin de Engineer, manufacturing systems.  Sacudidas y temblores (convulsiones).  Prdida de la conciencia. CUIDADOS EN EL HOGAR  No conduzca vehculos despus de beber alcohol.  Beba gran cantidad de lquido para mantener el pis (orina) de tono claro o de color amarillo plido. Evite la cafena.  Slo tome los medicamentos que le haya indicado su mdico. SOLICITE AYUDA SI:  Devuelve (vomita) repetidas veces.  No mejora luego de Time [employer].  Se intoxica con alcohol con frecuencia. El mdico podr ayudarlo a decidir si debe consultar a un terapeuta especializado en el abuso de sustancias. SOLICITE AYUDA DE INMEDIATO SI:  Siente temblores cuando deja de beber.  Tiene temblores o sacudidas.  Vomita sangre. Puede ser de color rojo brillante o similar a la borra del caf.  Nota sangre en las heces (movimiento intestinal).  Se siente mareado o se desvanece (se desmaya). ASEGRESE DE QUE:   Comprende estas instrucciones.  Controlar su afeccin.  Recibir ayuda de inmediato si no mejora o si empeora. Document Released: 09/13/2010 Document Revised: 04/13/2013 [facility] Patient Information 2014 [place], [place].  Problemas Con El Alcohol (Alcohol Problems) La mayora de los adultos que beben alcohol lo hacen con moderacin (no demasiado) y poseen bajo  riesgo de Warehouse manager problemas relacionados con la bebida. Sin embargo, todos los bebedores, inclusive los de bajo riesgo, Gaffer los riesgos de la salud que conlleva la ingesta de alcohol. RECOMENDACIONES PARA LOS BEBEDORES DE BAJO RIESGO Beber con moderacin. La ingesta moderada de alcohol se establece de la siguiente manera:   Hombres  no ms de dos tragos Google.  Mujeres  no ms de un trago Google.  Mayores de 65 aos  no ms de un trago Air cabin crew. Un trago estndar es 12 gramos de alcohol puro, lo que es lo mismo que una botella de 12 onzas de cerveza o Merchant navy officer, un vaso de vino de 5 onzas, o 1 onza y media de bebidas blancas (como whisky, brandy, vodka, o ron).  ABSTNGASE (NO BEBA) ALCOHOL:  Si est embarazada o contempla la posibilidad.  Cuando tome un medicamento que interacta con el alcohol.  Si usted es dependiente del alcohol.  Sufre alguna enfermedad en la que se prohba el consumo de alcohol (como lceras, enfermedades hepticas, o enfermedades cardacas). HABLE CON EL MDICO:  Si tiene riesgo de sufrir una enfermedad coronaria, converse sobre los potenciales beneficios y riesgos de la ingesta de alcohol: La ingesta baja a moderada de alcohol est asociada con tasas menores de enfermedades coronarias en ciertas poblaciones (por ejemplo, hombres mayores de 45 aos y mujeres postmenopusicas). Se aconseja a las personas no bebedoras o abstemias no comenzar con la ingesta baja a moderada para reducir el riesgo de enfermedades coronarias en funcin de evitar la aparicin de un problema relacionado con el alcohol. Pueden obtenerse efectos protectores similares a travs de una dieta y ejercicios adecuados.  Las mujeres y los ancianos tienen menos  cantidad de agua en el Alcoa Incorganismo que los hombres. Como consecuencia de [place]esto, las mujeres y los ancianos tienen mayor concentracin de alcohol en la sangre despus de beber la misma cantidad de alcohol.  La exposicin del feto al  alcohol puede ocasionar una gran cantidad de defectos de nacimientos dominados [place] Alcohlico Fetal (FAS) o Defectos de Nacimiento Relacionados con el Alcohol (ARBD). Aunque los FAS y los ARBD estn asociados con el consumo excesivo de alcohol durante el Fife Heightsembarazo, tambin se han informado trastornos de Leisure centre managerconducta en nios nacidos de madres que informaron haber bebido un trago en promedio por Mudloggerda durante el embarazo.  El abuso de alcohol (como el consumo de ms de cuatro tragos por ocasin en hombres y ms de tres tragos por ocasin en mujeres) perjudica a lo cognitivo (aprendizaje) y las funciones psicomotoras e incrementa el riesgo de problemas relacionados con el alcohol, inclusive accidentes y daos. PREGUNTAS CECA:   Algunas vez ha sentido que deba cortar con la bebida?  Se ha enojado usted con alguien que lo critic por lo que bebe?  Alguna vez se ha sentido mal o culpable por lo que bebe?  Ha tomado alguna vez un trago por la maana para calmar sus nervios o para deshacerse de una "resaca" (para "abrir los ojos")? Si ha respondido de Hondurasmanera positiva a [place]alguna de estas preguntas: Podra estar en riesgo de tener problemas relacionados con el alcohol si el consumo del mismo es:   Hombres: Mayor a 14 tragos por semana o ms de 4 tragos por ocasin.  Mujeres: Mayor a 7 tragos por semana o ms de 3 tragos por ocasin. Tiene usted o su familia alguna historia clnica de problemas relacionados con el alcohol como:  Prdida del conocimiento.  Disfuncin sexual.  Depresin.  Traumatismos.  Enfermedades hepticas.  Trastornos del sueo.  Hipertensin arterial.  Dolor crnico abdominal.  Alguna vez el beber le ha ocasionado problemas, como por ejemplo con su familia, en su rendimiento laboral (o escolar), accidentes o lesiones?  Ha tenido una compulsin a beber o se ha preocupado mientras lo haca?  Posee poco control o es incapaz de parar de beber una vez que ha  comenzado?  Ha tenido que beber para evitar sntomas de abstinencia?  Ha tenido problemas con la abstinencia como temblores, nuseas, sudor o cambios en el humor?  Necesita ms alcohol que antes para emborracharse?  Siente una fuerte necesidad de beber?  Cambia de planes para poder beber?  Alguna vez ha bebido en la maana para aliviar temblores o resaca? Si ha respondido a [place]alguna de estas preguntas de Emerson Electricmanera positiva, puede que sea el momento de hablar con un profesional, familiar o amigos y ver si ellos creen que tiene un problema. El alcoholismo es una dependencia qumica que puede Theme park managerempeorar y Environmental health practitionerllegar a destruir su salud y Teacher, English as a foreign languagesus relaciones. Muchos alcohlicos mueren, se empobrecen o terminan en prisin. Esto es a menudo el resultado de una dependencia qumica.  No se desaliente si no est listo para actuar inmediatamente.  Las decisiones para cambiar su comportamiento a menudo implican altas y bajas entre el deseo de Saint Barthelemycambiar y la sensacin de que no puede decidirse.  Intente pensar ms seriamente sobre su comportamiento frente a la bebida.  Piense en razones para dejarla. PARA OBTENER INFORMACIN ADICIONAL, CONCURRIR A:  The General Millsational Institute on Alcohol Abuse and Alcoholism (NIAAA) BasicStudents.dkwww.niaaa.nih.gov   ToysRusational Council on Alcoholism and Drug Dependence (NCADD) www.ncadd.org  American Society of Addiction Medicine (ASAM) RoyalDiary.glwww.asam.org  Document Released:  11/18/2007 Document Revised: 11/03/2011 ExitCare Patient Information 2014 [place], [place].   Emergency Department Resource Guide 1) Find a Doctor and Pay Out of Pocket Although you won't have to find out who is covered by your insurance plan, it is a good idea to ask around and get recommendations. You will then need to call the office and see if the doctor you have chosen will accept you as a new patient and what types of options they offer for patients who are self-pay. Some doctors offer discounts or will set up payment  plans for their patients who do not have insurance, but you will need to ask so you aren't surprised when you get to your appointment.  2) Contact Your Local Health Department Not all health departments have doctors that can see patients for sick visits, but many do, so it is worth a call to see if yours does. If you don't know where your local health department is, you can check in your phone book. The CDC also has a tool to help you locate your state's health department, and many state websites also have listings of all of their local health departments.  3) Find a Walk-in Clinic If your illness is not likely to be very severe or complicated, you may want to try a walk in clinic. These are popping up all over the country in pharmacies, drugstores, and shopping centers. They're usually staffed by nurse practitioners or physician assistants that have been trained to treat common illnesses and complaints. They're usually fairly quick and inexpensive. However, if you have serious medical issues or chronic medical problems, these are probably not your best option.  No Primary Care Doctor: - Call Health Connect at  [phone] - they can help you locate a primary care doctor that  accepts your insurance, provides certain services, etc. - Physician Referral Service- [phone]  Chronic Pain Problems: Organization         Address  Phone   Notes  [facility]  [phone] Patients need to be referred by their primary care doctor.   Medication Assistance: Organization         Address  Phone   Notes  [facility] Medication [facility] [address] [place] [place], [place] [address] [phone] --Must be a resident of [facility] -- Must have NO insurance coverage whatsoever (no Medicaid/ Medicare, etc.) -- The pt. MUST have a primary care doctor that directs their care regularly and follows them in the community   Med[facility]ist  [phone]   [employer]   [phone]    Agencies that provide inexpensive medical care: Organization         Address  Phone   Notes  [facility]  [phone]   [facility]    [phone]   [facility] [address] [place], [place] [address] [phone]   [facility] [address] [place], [place] [phone]   Planned Parenthood    [phone]   [facility]    [phone]   Community Health and [facility]  [address], [place] Phone:  [phone], Fax:  [phone] Hours of Operation:  9 am - 6 pm, M-F.  Also accepts Medicaid/Medicare and self-pay.  [facility]  [address], [place] Phone: [phone], Fax: [phone]. Hours of Operation:  8:30 am - 5:30  pm, M-F.  Also accepts Medicaid and self-pay.  [facility] [place] [address] [place] Phone: [phone]   [facility] [address] Natasha Bence [place], [place] [phone], Ext. [phone] Mondays & Thursdays: 7-9 AM.  First 15 patients are seen on a first come, first serve basis.    Medicaid-accepting [facility] Providers:  Organization         Address  Phone   Notes  [facility] [address], [place] [phone] Also accepts self-pay patients.  [facility] [address] Laurell Josephs [place], [place]  [phone]   [facility] [address], [place] [phone]   [facility] [address] [place] [phone]   Renaye Rakers [address], [place]   [phone] Only accepts [place] Access [place] patients after they have their name applied to their card.   Self-Pay (no insurance) in [facility]:  Organization         Address  Phone   Notes  Sickle Cell Patients, [facility] [address] [place], [place] [phone]   [facility] [address] [place], [place] [phone]   [facility]  [address], [place] [phone]   [facility]/Dr. Osei-Bonsu  [address] [place] or [address], [place] [phone] Phone number for both [place] and [place] locations is the same.  [facility] [address] [place] [phone]   [facility] [address], [place] or [address] [phone]   [facility] [address] [place] [phone], phone; [phone], fax Sees patients 1st and 3rd Saturday of every month.  Must not qualify for public or private insurance (i.e. Medicaid, Medicare, [place] Health Choice, Veterans' Benefits)  Household income should be no more than 200% of the poverty level The clinic cannot treat you if you are pregnant or think you are pregnant  Sexually transmitted diseases are not treated at the clinic.    Dental Care: Organization         Address  Phone  Notes  [facility] Department of [facility] [address] [place], [place] [phone] Accepts children up to age 18 who are enrolled in [place] or [place] Health Choice; pregnant women with a Medicaid card; and children who have applied for Medicaid or [place] Health Choice, but were declined, whose parents can pay a reduced fee at time of service.  [facility] Department of [facility]  [address], [place] [phone] Accepts children up to age 53 who are enrolled in [place] or [place] Health Choice; pregnant women with a Medicaid card; and children who have applied for Medicaid or [place] Health Choice, but were declined, whose parents can pay a reduced fee at time of service.  [facility]  [address] [place], [place] [phone] Patients are  seen by appointment only. Walk-ins are not accepted. [facility] will see patients 41 years of age and older. Monday - Tuesday (8am-5pm) Most Wednesdays (8:30-5pm) $30 per visit, cash only  [facility] Adult Dental Access PROGRAM  [address], [facility] [phone] Patients are seen by appointment only. Walk-ins are not  accepted. [facility] will see patients 64 years of age and older. One Wednesday Evening (Monthly: Volunteer Based).  $30 per visit, cash only  [employer]  [phone] for adults; Children under age 78, call Graduate Pediatric Dentistry at [phone]. Children aged 68-14, please call [phone] to request a pediatric application.  Dental services are provided in all areas of dental care including fillings, crowns and bridges, complete and partial dentures, implants, gum treatment, root canals, and extractions. Preventive care is also provided. Treatment is provided to both adults and children. Patients are selected via a lottery and there is often a waiting list.   [facility] [address], [place]  [phone] www.[email or web address]   [facility] [address] [place], [place] [phone], Ext. [phone] Second and Fourth Thursday of each month, opens at 6:30 AM; Clinic ends at 9 AM.  Patients are seen on a first-come first-served basis, and a limited number are seen during each clinic.   [facility]  [address] Ether Griffins [place], [place] [phone]   Eligibility Requirements You must have lived in [place], [place], or [place] counties for at least the last three months.   You cannot be eligible for state or federal sponsored [place], including [employer]IG[employer], [place], or [employer].   You generally cannot be eligible for healthcare insurance through your employer.    How to apply: Eligibility screenings are held every Tuesday and Wednesday afternoon from 1:00 pm until 4:00  pm. You do not need an appointment for the interview!  [facility] [address] [place], [place] [phone]   [facility] Health Department  [phone]   [facility] Health Department  [phone]   [facility] Health Department  [phone]    Behavioral Health Resources in the Community: Intensive Outpatient Programs Organization         Address  Phone  Notes  [facility] Services [address] [place], [place] [phone]   [facility] Outpatient [address], [place], [place] [phone]   [facility] [address] [place], [place]  [phone]   [facility] [place] [facility] Mental Health [address],  [place], [place] [phone] or [phone]   Substance Abuse Resources Organization         Address  Phone  Notes  Alcohol and Drug Services  [phone]   [facility]  [phone]   [facility] [place]  [phone]   Floydene Flock  [phone]   Residential & Outpatient Substance Abuse Program  [phone]   Psychological Services Organization         Address  Phone  Notes  [facility]  [phone]   [facility]  [phone]   [facility] [address], [place] [phone] or [phone]    Mobile Crisis Teams Organization         Address  Phone  Notes  [facility], Mobile Crisis Care Unit  [phone]   [facility]  [address] [place], [place] [phone]   Doristine Locks [address] [place] [phone]    Self-Help/Support Groups Organization         Address  Phone             Notes  [facility] [place] - variety of support groups  336- [phone] Call for more information  Narcotics Anonymous (NA), [facility]  [address], [employer] [place]  2 meetings at this location   Residential Sports administrator          Address  Phone  Notes  [facility] [address],    [place]  [phone]   [facility]  [address] [place] [address], [place], [place] [phone]   [facility] [address] [place], [place] [phone] Admissions: 8am-3pm M-F  [facility] [address]    [employer], [place] [phone]   [facility] [address] [place], [place], [place] [phone]   [facility] [address]  [place], [place] [phone]   [facility] - Intensive Outpatient [address] Laurell Joseph[address], [place], [place] [phone]   [facility] ([facility] [address] [place].,  [place], [place] [phone] or [phone]   [facility] ([facility]) [address], [place], [place] [phone] Accepts Medicaid  [facility] [address]  [place] [phone] Substance Abuse/Addiction Treatment   [facility] Organization         Address  Phone  Notes  [facility]  [phone]   Angie Fava, PhD [address] Ervin Knack [place], [place]   [phone] or [phone]   [facility]   [address] [place], [place] [phone]   [facility] [address], [place], [place] [phone] Insurance/Medicaid/sponsorship through [facility] [address]                                    [place], [place] [phone] Therapy/tele-psych/case  [facility] [address] [place] [address], [place] [phone]    Dr. Lolly Mustache  [phone]   [facility] [place]  [employer] [facility] Dept. 1) [address] [place] 2) [address] [place] 3)  [address], [place] [phone]  [phone]   [facility] Child Abuse Hotline [phone] or [phone] (After Hours)

## 2013-11-09 NOTE — ED Notes (Signed)
Pt told staff he was glad to get help with his alcohol problem but he wants to know what time we will let him leave today.

## 2013-11-09 NOTE — BH Assessment (Signed)
Assessment Note  Willie CaroliRoberto Boyle is an 43 y.o. male. Pt presents as a spanish speaking male and the assistance of Delorise RoyalsJulie Sowell Spanish Interpreter services were provided. Pt presents with C/O medical clearance. Patient presents with C/O nausea and stomach pain and reports he was vomiting blood. Pt reports that he drank too much liquor today and it made him sick. Pt is requesting etoh detox so he can get professional help with his drinking. Patient reports that he drinks a combination of beer and etoh. Patient reports that he drinks 18-20 12oz beers daily. Pt reports increased etoh consumption over the past 3-4 months. Patient reports [place]. Patient reports loss of appetite and reports that he has not slept well for the past 3-4 days. Patient reports increased nightmares and thinks that his nightmares are triggered by his alcohol use. Patient reports increased anxiety and presents mildly anxious. Pt does not reports hx of seizure's or DT's. Pt denies current SI,HI, and no AVH reported.   Consulted with Dr. Ladona Ridgelaylor, Psychiatrist and Denice BorsShuvon Rankin,NP who agreed that patient meets inpatient detox criteria and recommend inpatient treatment. Notified EDP Dr. Karma GanjaLinker that [facility] per Dr. Carolanne GrumblingGerald Taylor and Assunta FoundShuvon Rankin NP. TTS [facility] who was translating for patient to sign his consent to treatment. Patient informed interpreter and TTS staff that he wants to leave and go home so he can return to work in the morning. Pt refusing inpatient detox treatment at this time.  Patient will be discharged from ED by EDP with SA and crisis resources for follow-up. TTS provided patient with crisis resources and SA referrals.    Axis I: Alcohol Use Disorder Axis II: Deferred Axis III:  Past Medical History  Diagnosis Date  . Alcohol abuse    Axis IV: other psychosocial or environmental  problems, problems related to social environment and problems with access to health care services Axis V: 41-50 serious symptoms  Past Medical History:  Past Medical History  Diagnosis Date  . Alcohol abuse     History reviewed. No pertinent past surgical history.  Family History: No family history on file.  Social History:  reports that he has been smoking.  He does not have any smokeless tobacco history on file. He reports that he drinks alcohol. He reports that he does not use illicit drugs.  Additional Social History:  Alcohol / Drug Use History of alcohol / drug use?: Yes Substance #1 Name of Substance 1:  (Etoh-Beer) 1 - Age of First Use:  (17) 1 - Amount (size/oz):  (18-20 (12)oz beers) 1 - Frequency:  (Daily) 1 - Duration:  (on-going use since age 43) 1 - Last Use / Amount:  (11/09/13- 4-5 12oz beers)  CIWA: CIWA-Ar BP: 146/92 mmHg Pulse Rate: 97 COWS:    Allergies: No Known Allergies  Home Medications:  (Not in a hospital admission)  OB/GYN Status:  No LMP for male patient.  General Assessment Data Is this a Tele or Face-to-Face Assessment?: Face-to-Face Is this an Initial Assessment or a Re-assessment for this encounter?: Initial Assessment Living Arrangements: Non-relatives/Friends Can pt return to current living arrangement?: Yes Admission Status: Voluntary Is patient capable of signing voluntary admission?: Yes Transfer from: Unknown Referral Source: Other ([facility])     [facility] Living Arrangements: Non-relatives/Friends Name of Psychiatrist: No Current Providers Name of Therapist: No Current Providers     Risk  to self Suicidal Ideation: No Suicidal Intent: No Is patient at risk for suicide?: No Suicidal Plan?: No Access to Means: No What has been your use of drugs/alcohol within the last 12 months?: na Previous Attempts/Gestures: No How many times?: 0 Other Self Harm Risks: na Triggers for Past Attempts: Other  (Comment) Intentional Self Injurious Behavior: None Family Suicide History: No Recent stressful life event(s): Other (Comment) (no health insurance,Pt does not speak or understand [place] ) Persecutory voices/beliefs?: No Depression: Yes Depression Symptoms: Feeling worthless/self pity Substance abuse history and/or treatment for substance abuse?: Yes Suicide prevention information given to non-admitted patients: Yes  Risk to Others Homicidal Ideation: No Thoughts of Harm to Others: No Current Homicidal Intent: No Current Homicidal Plan: No Access to Homicidal Means: No Identified Victim: na History of harm to others?: No Assessment of Violence: None Noted Violent Behavior Description: Cooperative,Anxious Does patient have access to weapons?: No Criminal Charges Pending?: No Does patient have a court date: No  Psychosis Hallucinations: None noted Delusions: None noted  Mental Status Report Appear/Hygiene: Disheveled Eye Contact: Fair Motor Activity: Freedom of movement Speech: Language other than English Level of Consciousness: Alert Mood: Anxious Affect: Anxious Anxiety Level: Minimal Thought Processes: Coherent;Relevant Judgement: Impaired Orientation: Person;Place;Time;Situation Obsessive Compulsive Thoughts/Behaviors: None  Cognitive Functioning Concentration: Normal Memory: Recent Intact;Remote Intact IQ: Average Insight: Fair Impulse Control: Poor Appetite: Fair Weight Loss: 0 Weight Gain: 0 Sleep: Decreased Total Hours of Sleep:  (patient reports that he has been unable to sleep) Vegetative Symptoms: Not bathing;Decreased grooming  ADLScreening [facility] Assessment Services) Patient's cognitive ability adequate to safely complete daily activities?: Yes Patient able to express need for assistance with ADLs?: Yes (Pt requires spanish interpreter) Independently performs ADLs?: Yes (appropriate for developmental age)  Prior Inpatient Therapy Prior Inpatient  Therapy: No Prior Therapy Dates: n/a Prior Therapy Facilty/Provider(s): n/a Reason for Treatment: n/a  Prior Outpatient Therapy Prior Outpatient Therapy: No Prior Therapy Dates: n/a Prior Therapy Facilty/Provider(s): n/a Reason for Treatment: n/a  ADL Screening (condition at time of admission) Patient's cognitive ability adequate to safely complete daily activities?: Yes Is the patient deaf or have difficulty hearing?: No Does the patient have difficulty seeing, even when wearing glasses/contacts?: No Does the patient have difficulty concentrating, remembering, or making decisions?: No Patient able to express need for assistance with ADLs?: Yes (Pt requires spanish interpreter) Does the patient have difficulty dressing or bathing?: No Independently performs ADLs?: Yes (appropriate for developmental age) Does the patient have difficulty walking or climbing stairs?: No Weakness of Legs: None Weakness of Arms/Hands: None  Home Assistive Devices/Equipment Home Assistive Devices/Equipment: None    Abuse/Neglect Assessment (Assessment to be complete while patient is alone) Physical Abuse: Denies Verbal Abuse: Denies Sexual Abuse: Denies Exploitation of patient/patient's resources: Denies Self-Neglect: Denies Values / Beliefs Cultural Requests During Hospitalization: None Spiritual Requests During Hospitalization: None   Advance Directives (For Healthcare) Advance Directive: Patient does not have advance directive;Patient would not like information    Additional Information 1:1 In Past 12 Months?: No CIRT Risk: No Elopement Risk: No Does patient have medical clearance?: Yes     Disposition:  Disposition Initial Assessment Completed for this Encounter: Yes Disposition of Patient: Other dispositions Type of inpatient treatment program: Adult Other disposition(s): Other (Comment) (Pt accepted to [facility] and refuses inpatient detox treatment.)  On Site Evaluation by:    Reviewed with Physician:    Gerline Legacy, MS, LCASA Assessment Counselor  11/09/2013 6:26 PM

## 2013-11-09 NOTE — ED Provider Notes (Signed)
6:09 PM per TTS patient had been evaluated and accepted at [facility] for alcohol detox.  No SI/HI.  Now he is deciding that he needs to leave to return to his job.  He is here on a voluntary basis.  Will discharge with outpatient resources.   Ethelda ChickMartha K Linker, MD 11/09/13 [id number]

## 2013-11-09 NOTE — BH Assessment (Signed)
Notified EDP Dr. Karma GanjaLinker that [facility] per Dr. Carolanne GrumblingGerald Taylor and Assunta FoundShuvon Rankin NP. TTS spoke with interpreter Derek [facility]oundicardo [facility] who was translating for patient to sign his consent to treatment.   Patient informed interpreter and TTS staff that he wants to leave and go home so he can return to work in the morning.  Patient will be discharged from ED by EDP with SA and crisis resources for follow-up.   Willie PeachNajah Jakhai Fant, MS, LCASA Assessment Counselor

## 2013-11-10 NOTE — Consult Note (Signed)
Face to face evaluation and I agree with this note 

## 2017-11-14 ENCOUNTER — Encounter (HOSPITAL_COMMUNITY): Payer: Self-pay

## 2017-11-14 ENCOUNTER — Emergency Department (HOSPITAL_COMMUNITY)
Admission: EM | Admit: 2017-11-14 | Discharge: 2017-11-14 | Disposition: A | Discharge status: Home / Self Care | Payer: Self-pay | Attending: Emergency Medicine | Admitting: Emergency Medicine

## 2017-11-14 ENCOUNTER — Other Ambulatory Visit: Payer: Self-pay

## 2017-11-14 DIAGNOSIS — F1010 Alcohol abuse, uncomplicated: Secondary | ICD-10-CM

## 2017-11-14 DIAGNOSIS — F10120 Alcohol abuse with intoxication, uncomplicated: Secondary | ICD-10-CM | POA: Insufficient documentation

## 2017-11-14 DIAGNOSIS — F17200 Nicotine dependence, unspecified, uncomplicated: Secondary | ICD-10-CM | POA: Insufficient documentation

## 2017-11-14 MED ORDER — DICYCLOMINE HCL 20 MG PO TABS
20.0000 mg | ORAL_TABLET | Freq: Once | ORAL | Status: AC
  Administered 2017-11-14: 20 mg via ORAL
  Filled 2017-11-14: qty 1

## 2017-11-14 MED ORDER — FAMOTIDINE 20 MG PO TABS: 20.0000 mg | ORAL_TABLET | Freq: Once | ORAL | Status: DC

## 2017-11-14 MED ORDER — FAMOTIDINE 20 MG PO TABS: 20.0000 mg | ORAL_TABLET | Freq: Two times a day (BID) | ORAL | 0 refills | Status: DC

## 2017-11-14 NOTE — ED Notes (Signed)
He ambulates without difficulty; this is his fourth trip to the bathroom. He remains in no distress.

## 2017-11-14 NOTE — ED Notes (Signed)
[facility] Expected date: 11/14/17 Expected time: 7:42 AM Means of arrival: Ambulance Comments: Spanish speaking ETOH

## 2017-11-14 NOTE — ED Triage Notes (Signed)
He tells us that he has been drinking and that his "stomach hurts". He arrives here in no distress and is ambulatory to our stretcher.

## 2017-11-14 NOTE — ED Notes (Signed)
I am unable to locate pt. 

## 2017-11-14 NOTE — Discharge Instructions (Addendum)
We have given you a prescription for pepcid- this is to help with your upset stomach. Take this once in the morning and once in the evening. We have prescribed you new medication(s) today. Discuss the medications prescribed today with your pharmacist as they can have adverse effects and interactions with your other medicines including over the counter and prescribed medications. Seek medical evaluation if you start to experience new or abnormal symptoms after taking one of these medicines, seek care immediately if you start to experience difficulty breathing, feeling of your throat closing, facial swelling, or rash as these could be indications of a more serious allergic reaction    Follow up with your primary care doctor or the [facility] in the next 1 week for re-evaluation.   Return to the emergency department for new or worsening symptoms or any other concerns.   Google Translate:  Conley RollsLe hemos dado una receta para Pepcid, esto es para ayudarle con su Programme researcher, broadcasting/film/videomalestar estomacal. Tome esto una vez en la maana y Pollyann Savoyuna vez en la tarde. Hoy le hemos recetado medicamentos nuevos. Discuta los medicamentos recetados hoy con su farmacutico, ya que pueden tener efectos adversos e interacciones con sus otros medicamentos, incluidos los medicamentos de venta libre y recetados. Solicite una evaluacin mdica si comienza a experimentar sntomas nuevos o anormales despus de tomar uno de estos medicamentos, busque atencin mdica de inmediato si comienza a experimentar dificultad para respirar, cierre de la garganta, hinchazn facial o erupcin cutnea, ya que podran ser indicios de una afeccin ms grave. reaccin alrgica Haga un seguimiento con su mdico de atencin primaria o con la clnica comunitaria de salud de conos en la prxima semana para una nueva evaluacin. Regrese al departamento de emergencias para los sntomas nuevos o que [place].

## 2017-11-14 NOTE — ED Provider Notes (Signed)
[facility]-EMERGENCY DEPT Provider Note   CSN: [id number] Arrival date & time: 11/14/17  [id number]     History   Chief Complaint Chief Complaint  Patient presents with  . Alcohol Intoxication    HPI Willie Boyle is a 47 y.o. male with a hx of alcohol abuse who arrives to the ED via EMS for alcohol intoxication today.  Patient states that he has been drinking daily for the past 6 days, states that he typically drinks every day, sometimes somewhat less.  Over the past 6 days he has been drinking anywhere from 12-18 beers.  States that his last beer was 30 minutes prior to arrival.  Patient states he "just does not feel well." states his stomach somewhat hurts and he had some nausea/vomiting/diarrhea prior to arrival.  Diarrhea and emesis are nonbloody.  No specific alleviating or aggravating factors.  Denies fever, chest pain, difficulty breathing, testicular pain, or testicular swelling.  Patient denies history of alcohol withdrawal, upon chart review there is note of alcohol withdrawal and his past medical history.  Patient denies history of withdrawal seizures requiring hospitalization for alcohol withdrawal.  Translator line used throughout encounter.   HPI  Past Medical History:  Diagnosis Date  . Alcohol abuse     Patient Active Problem List   Diagnosis Date Noted  . Alcohol abuse 11/09/2013  . Alcohol withdrawal (HCC) 11/09/2013    History reviewed. No pertinent surgical history.      Home Medications    Prior to Admission medications   Not on File    Family History History reviewed. No pertinent family history.  Social History Social History   Tobacco Use  . Smoking status: Current Every Day Smoker  Substance Use Topics  . Alcohol use: Yes  . Drug use: No     Allergies   Patient has no known allergies.   Review of Systems Review of Systems  Constitutional: Negative for chills and fever.  Respiratory: Negative for shortness of  breath.   Cardiovascular: Negative for chest pain.  Gastrointestinal: Positive for abdominal pain, diarrhea, nausea and vomiting. Negative for blood in stool and constipation.  Genitourinary: Negative for dysuria, scrotal swelling and testicular pain.  All other systems reviewed and are negative.    Physical Exam Updated Vital Signs BP (!) 136/96 (BP Location: Right Arm)   Pulse (!) 101   Resp 17   SpO2 97%   Physical Exam  Constitutional: He appears well-developed and well-nourished. No distress.  HENT:  Head: Normocephalic and atraumatic.  Eyes: Conjunctivae are normal. Right eye exhibits no discharge. Left eye exhibits no discharge.  Cardiovascular: Regular rhythm. Tachycardia present.  No murmur heard. Pulmonary/Chest: Effort normal and breath sounds normal. No respiratory distress. He has no wheezes. He has no rales.  Abdominal: Soft. He exhibits no distension. There is no tenderness.  Neurological: He is alert. He displays no tremor.  Speech is mildly slurred.  Gait is somewhat unsteady upon arrival with initial evaluation.  Skin: Skin is warm and dry. No rash noted.  Psychiatric: He has a normal mood and affect. His behavior is normal.  Nursing note and vitals reviewed.   ED Treatments / Results  Labs (all labs ordered are listed, but only abnormal results are displayed) Labs Reviewed - No data to display  EKG None  Radiology No results found.  Procedures Procedures (including critical care time)  Medications Ordered in ED Medications  famotidine (PEPCID) tablet 20 mg (20 mg Oral Refused 11/14/17 1325)  dicyclomine (BENTYL) tablet 20 mg (20 mg Oral Given 11/14/17 1024)     Initial Impression / Assessment and Plan / ED Course  I have reviewed the triage vital signs and the nursing notes.  Pertinent labs & imaging results that were available during my care of the patient were reviewed by me and considered in my medical decision making (see chart for  details).   Patient presents with alcohol intoxication.  Patient is nontoxic-appearing, in no apparent distress, noted to be mildly tachycardic and mildly hypertensive, no indication of hypertensive emergency.  Initial evaluation reveals some mildly slurred speech and somewhat unsteady gait consistent with EtOH on board.  Patient reports some abdominal discomfort and N/V/D.  Abdomen is completely nontender, no peritoneal signs, doubt  appendicitis, bowel obstruction, bowel perforation, cholecystitis, diverticulitis, or pancreatitis at this time. Will administer bentyl and observe patient in the ED.   12:00: Re-eval: Patient feeling improved, abdominal discomfort improved, patient is tolerating PO in the emergency department.  Speech is clear, gait is steady and intact. Patient is clinically sober and safe for discharge at this time. Will provide prescription for pepcid.   I discussed treatment plan, need for PCP follow-up, and return precautions with the patient. Provided opportunity for questions, patient confirmed understanding and is in agreement with plan.   Findings and plan of care discussed with supervising physician Dr. Denton Lank who is in agreement with plan.   Final Clinical Impressions(s) / ED Diagnoses   Final diagnoses:  ETOH abuse    ED Discharge Orders        Ordered    famotidine (PEPCID) 20 MG tablet  2 times daily     11/14/17 [address] [place]ine, PA-C 11/14/17 1517    Cathren Laine, MD 11/14/17 1540

## 2017-11-14 NOTE — ED Notes (Signed)
I have attempted to find him several times to no avail.

## 2017-11-15 ENCOUNTER — Encounter (HOSPITAL_COMMUNITY): Payer: Self-pay | Admitting: *Deleted

## 2017-11-15 ENCOUNTER — Encounter (HOSPITAL_COMMUNITY): Payer: Self-pay

## 2017-11-15 ENCOUNTER — Emergency Department (HOSPITAL_COMMUNITY)
Admission: EM | Admit: 2017-11-15 | Discharge: 2017-11-15 | Disposition: A | Discharge status: Home / Self Care | Payer: Self-pay | Attending: Emergency Medicine | Admitting: Emergency Medicine

## 2017-11-15 ENCOUNTER — Other Ambulatory Visit: Payer: Self-pay

## 2017-11-15 ENCOUNTER — Emergency Department (HOSPITAL_COMMUNITY)
Admission: EM | Admit: 2017-11-15 | Discharge: 2017-11-16 | Disposition: A | Discharge status: Home / Self Care | Payer: Self-pay | Attending: Physician Assistant | Admitting: Physician Assistant

## 2017-11-15 DIAGNOSIS — R109 Unspecified abdominal pain: Secondary | ICD-10-CM | POA: Insufficient documentation

## 2017-11-15 DIAGNOSIS — F1010 Alcohol abuse, uncomplicated: Secondary | ICD-10-CM

## 2017-11-15 DIAGNOSIS — F10120 Alcohol abuse with intoxication, uncomplicated: Secondary | ICD-10-CM | POA: Insufficient documentation

## 2017-11-15 DIAGNOSIS — Z5321 Procedure and treatment not carried out due to patient leaving prior to being seen by health care provider: Secondary | ICD-10-CM | POA: Insufficient documentation

## 2017-11-15 DIAGNOSIS — K2970 Gastritis, unspecified, without bleeding: Secondary | ICD-10-CM | POA: Insufficient documentation

## 2017-11-15 DIAGNOSIS — F17210 Nicotine dependence, cigarettes, uncomplicated: Secondary | ICD-10-CM | POA: Insufficient documentation

## 2017-11-15 DIAGNOSIS — F10920 Alcohol use, unspecified with intoxication, uncomplicated: Secondary | ICD-10-CM

## 2017-11-15 DIAGNOSIS — F10929 Alcohol use, unspecified with intoxication, unspecified: Secondary | ICD-10-CM | POA: Insufficient documentation

## 2017-11-15 DIAGNOSIS — Y906 Blood alcohol level of 120-199 mg/100 ml: Secondary | ICD-10-CM | POA: Insufficient documentation

## 2017-11-15 LAB — COMPREHENSIVE METABOLIC PANEL
ALT: 40 U/L (ref 17–63)
AST: 75 U/L — ABNORMAL HIGH (ref 15–41)
Albumin: 3.3 g/dL — ABNORMAL LOW (ref 3.5–5.0)
Alkaline Phosphatase: 73 U/L (ref 38–126)
Anion gap: 16 — ABNORMAL HIGH (ref 5–15)
BILIRUBIN TOTAL: 1.2 mg/dL (ref 0.3–1.2)
BUN: 20 mg/dL (ref 6–20)
CHLORIDE: 96 mmol/L — AB (ref 101–111)
CO2: 18 mmol/L — ABNORMAL LOW (ref 22–32)
Calcium: 8 mg/dL — ABNORMAL LOW (ref 8.9–10.3)
Creatinine, Ser: 0.9 mg/dL (ref 0.61–1.24)
Glucose, Bld: 110 mg/dL — ABNORMAL HIGH (ref 65–99)
POTASSIUM: 3.3 mmol/L — AB (ref 3.5–5.1)
Sodium: 130 mmol/L — ABNORMAL LOW (ref 135–145)
TOTAL PROTEIN: 5.7 g/dL — AB (ref 6.5–8.1)

## 2017-11-15 LAB — CBC WITH DIFFERENTIAL/PLATELET
Basophils Absolute: 0 10*3/uL (ref 0.0–0.1)
Basophils Relative: 0 %
EOS PCT: 0 %
Eosinophils Absolute: 0 10*3/uL (ref 0.0–0.7)
HEMATOCRIT: 43.3 % (ref 39.0–52.0)
Hemoglobin: 15.5 g/dL (ref 13.0–17.0)
LYMPHS PCT: 10 %
Lymphs Abs: 1 10*3/uL (ref 0.7–4.0)
MCH: 28.8 pg (ref 26.0–34.0)
MCHC: 35.8 g/dL (ref 30.0–36.0)
MCV: 80.3 fL (ref 78.0–100.0)
Monocytes Absolute: 0.7 10*3/uL (ref 0.1–1.0)
Monocytes Relative: 7 %
Neutro Abs: 8.7 10*3/uL — ABNORMAL HIGH (ref 1.7–7.7)
Neutrophils Relative %: 83 %
PLATELETS: 155 10*3/uL (ref 150–400)
RBC: 5.39 MIL/uL (ref 4.22–5.81)
RDW: 12.8 % (ref 11.5–15.5)
WBC: 10.5 10*3/uL (ref 4.0–10.5)

## 2017-11-15 LAB — LIPASE, BLOOD: Lipase: 34 U/L (ref 11–51)

## 2017-11-15 LAB — ETHANOL: ALCOHOL ETHYL (B): 171 mg/dL — AB (ref <10)

## 2017-11-15 MED ORDER — PANTOPRAZOLE SODIUM 40 MG IV SOLR
40.0000 mg | Freq: Once | INTRAVENOUS | Status: AC
  Administered 2017-11-15: 40 mg via INTRAVENOUS
  Filled 2017-11-15: qty 40

## 2017-11-15 MED ORDER — ONDANSETRON HCL 4 MG/2ML IJ SOLN
4.0000 mg | Freq: Once | INTRAMUSCULAR | Status: AC
  Administered 2017-11-15: 4 mg via INTRAVENOUS
  Filled 2017-11-15: qty 2

## 2017-11-15 MED ORDER — SODIUM CHLORIDE 0.9 % IV BOLUS (SEPSIS)
1000.0000 mL | Freq: Once | INTRAVENOUS | Status: AC
  Administered 2017-11-15: 1000 mL via INTRAVENOUS

## 2017-11-15 MED ORDER — LORAZEPAM 2 MG/ML IJ SOLN
0.5000 mg | Freq: Once | INTRAMUSCULAR | Status: AC
  Administered 2017-11-15: 0.5 mg via INTRAVENOUS
  Filled 2017-11-15: qty 1

## 2017-11-15 NOTE — ED Notes (Signed)
Pt ambulatory with steady gait on arrival to ED. Despite indicating need for UA, pt did not obtain specimen when he went to the restroom. Pt verbalized he would get it the next time he had to void.

## 2017-11-15 NOTE — ED Triage Notes (Signed)
Pt left and returned with EMS due to wait time. He remains intoxicated with ETOH noted

## 2017-11-15 NOTE — ED Notes (Signed)
Seen getting into a car and leaving

## 2017-11-15 NOTE — ED Provider Notes (Signed)
[facility]ENC[facility] Provider Note   CSN: [id number] Arrival date & time: 11/15/17  2136   History   Chief Complaint Chief Complaint  Patient presents with  . Abdominal Pain  . Alcohol Intoxication    HPI Willie Boyle is a 47 y.o. male who presents with hematemesis. PMH significant for alcohol abuse. He states that he's had abdominal pain, N/V/D. He was seen in the ED yesterday for the same. It also appears he went to [facility] twice today and [facility]. ED notes states that he left twice and would call EMS to bring him back. He states he only drank two beers today. He started having blood streaked vomit today. No fever, chest pain, SOB, current abdominal pain, bloody stools. He lives with his cousin. He denies hx of alcohol withdrawal seizures. He is requesting something to help him sleep because he's only had 10 hours of sleep for the past three days.  HPI  Past Medical History:  Diagnosis Date  . Alcohol abuse     Patient Active Problem List   Diagnosis Date Noted  . Alcohol abuse 11/09/2013  . Alcohol withdrawal (HCC) 11/09/2013    History reviewed. No pertinent surgical history.      Home Medications    Prior to Admission medications   Medication Sig Start Date End Date Taking? Authorizing Provider  Aspirin-Acetaminophen-Caffeine (EXCEDRIN PO) Take 1 tablet by mouth daily as needed (pain/headache).   Yes [provider]  famotidine (PEPCID) 20 MG tablet Take 1 tablet (20 mg total) by mouth 2 (two) times daily. 11/14/17   Petrucelli, Pleas KochSamantha R, PA-C    Family History No family history on file.  Social History Social History   Tobacco Use  . Smoking status: Current Every Day Smoker  . Smokeless tobacco: Never Used  Substance Use Topics  . Alcohol use: Yes  . Drug use: No     Allergies   Patient has no known allergies.   Review of Systems Review of Systems  Constitutional: Negative for fever.  Respiratory: Negative for  shortness of breath.   Cardiovascular: Negative for chest pain.  Gastrointestinal: Positive for abdominal pain, diarrhea, nausea and vomiting.       +hematemesis  Genitourinary: Negative for dysuria.  Psychiatric/Behavioral: Positive for sleep disturbance. The patient is nervous/anxious.   All other systems reviewed and are negative.    Physical Exam Updated Vital Signs BP 134/74   Pulse 100   Temp 99.8 F (37.7 C) (Oral)   Resp 20   Ht 5\' 7"  (1.702 m)   Wt 73.9 kg (163 lb)   SpO2 96%   BMI 25.53 kg/m   Physical Exam  Constitutional: He is oriented to person, place, and time. He appears well-developed and well-nourished. No distress.  HENT:  Head: Normocephalic and atraumatic.  Eyes: Pupils are equal, round, and reactive to light. Conjunctivae are normal. Right eye exhibits no discharge. Left eye exhibits no discharge. No scleral icterus.  Neck: Normal range of motion.  Cardiovascular: Tachycardia present. Exam reveals no gallop and no friction rub.  No murmur heard. Pulmonary/Chest: Effort normal and breath sounds normal. No stridor. No respiratory distress. He has no wheezes. He has no rales. He exhibits no tenderness.  Abdominal: Soft. Bowel sounds are normal. He exhibits no distension. There is no tenderness.  Neurological: He is alert and oriented to person, place, and time.  Skin: Skin is warm and dry.  Psychiatric: His behavior is normal. His mood appears anxious.  Nursing  note and vitals reviewed.    ED Treatments / Results  Labs (all labs ordered are listed, but only abnormal results are displayed) Labs Reviewed  ETHANOL - Abnormal; Notable for the following components:      Result Value   Alcohol, Ethyl (B) 171 (*)    All other components within normal limits  CBC WITH DIFFERENTIAL/PLATELET - Abnormal; Notable for the following components:   Neutro Abs 8.7 (*)    All other components within normal limits  COMPREHENSIVE METABOLIC PANEL - Abnormal; Notable  for the following components:   Sodium 130 (*)    Potassium 3.3 (*)    Chloride 96 (*)    CO2 18 (*)    Glucose, Bld 110 (*)    Calcium 8.0 (*)    Total Protein 5.7 (*)    Albumin 3.3 (*)    AST 75 (*)    Anion gap 16 (*)    All other components within normal limits  URINALYSIS, ROUTINE W REFLEX MICROSCOPIC - Abnormal; Notable for the following components:   Color, Urine STRAW (*)    Ketones, ur 5 (*)    All other components within normal limits  LIPASE, BLOOD    EKG None  Radiology No results found.  Procedures Procedures (including critical care time)  Medications Ordered in ED Medications  sodium chloride 0.9 % bolus 1,000 mL (1,000 mLs Intravenous New Bag/Given 11/15/17 2246)  LORazepam (ATIVAN) injection 0.5 mg (0.5 mg Intravenous Given 11/15/17 2242)  ondansetron (ZOFRAN) injection 4 mg (4 mg Intravenous Given 11/15/17 2242)  pantoprazole (PROTONIX) injection 40 mg (40 mg Intravenous Given 11/15/17 2242)     Initial Impression / Assessment and Plan / ED Course  I have reviewed the triage vital signs and the nursing notes.  Pertinent labs & imaging results that were available during my care of the patient were reviewed by me and considered in my medical decision making (see chart for details).  47 year old male presents with abdominal pain, N/V/D and alcohol intoxication. He is tachycardic but otherwise vitals are normal. On exam the patient is anxious appearing. Abdomen is soft, non-tender. CBC is normal. CMP is remarkable for hyponatremia (130), hypokalemia (3.3), hypochloremia (96), low CO2 (18), elevated anion gap (16), elevated AST (75). Will hydrate and give small dose of Ativan and Zofran. Will give Protonix. Care transferred to T Kirichenko PA-C at shift change. Pt will likely be discharged once sober and able to tolerate PO.  Final Clinical Impressions(s) / ED Diagnoses   Final diagnoses:  Alcoholic intoxication without complication [facility])    ED Discharge  Orders    None       Bethel Born, PA-C 11/16/17 0044    Vanetta Mulders, MD 11/19/17 [phone]

## 2017-11-15 NOTE — ED Triage Notes (Signed)
Pt was here yesterday with same, ETOH and stomach pain. Ambulatory

## 2017-11-15 NOTE — ED Notes (Signed)
Pt left and called EMS to come back.

## 2017-11-15 NOTE — ED Triage Notes (Addendum)
Pt via EMS for ETOH intoxication and abd pain with N/V x 2 days. Per EMS, fire on scene reported they had seen patient twice today. Pt seen in ED for same complaint yesterday and d/c. Pt reports he has only had 2 beers today at 7 PM. Pt alert and oriented. RUQ and RLQ tender to palp. EMS VS: 136/94, 112 HR, 20 RR, 96% on RA, 12 lead ST. 16 G LAC. 4 mg zofran given en route. Pt speaks primarily Spanish.

## 2017-11-16 LAB — URINALYSIS, ROUTINE W REFLEX MICROSCOPIC
Bilirubin Urine: NEGATIVE
Glucose, UA: NEGATIVE mg/dL
Hgb urine dipstick: NEGATIVE
Ketones, ur: 5 mg/dL — AB
LEUKOCYTES UA: NEGATIVE
Nitrite: NEGATIVE
Protein, ur: NEGATIVE mg/dL
Specific Gravity, Urine: 1.008 (ref 1.005–1.030)
pH: 7 (ref 5.0–8.0)

## 2017-11-16 MED ORDER — LORAZEPAM 2 MG/ML IJ SOLN
1.0000 mg | Freq: Once | INTRAMUSCULAR | Status: AC
  Administered 2017-11-16: 1 mg via INTRAVENOUS
  Filled 2017-11-16: qty 1

## 2017-11-16 MED ORDER — FAMOTIDINE 20 MG PO TABS: 20.0000 mg | ORAL_TABLET | Freq: Two times a day (BID) | ORAL | 0 refills | Status: DC

## 2017-11-16 MED ORDER — GI COCKTAIL ~~LOC~~
30.0000 mL | Freq: Once | ORAL | Status: AC
  Administered 2017-11-16: 30 mL via ORAL
  Filled 2017-11-16: qty 30

## 2017-11-16 MED ORDER — PANTOPRAZOLE SODIUM 20 MG PO TBEC
20.0000 mg | DELAYED_RELEASE_TABLET | Freq: Every day | ORAL | 0 refills | Status: DC
Start: 2017-11-16 — End: 2017-11-16

## 2017-11-16 MED ORDER — SODIUM CHLORIDE 0.9 % IV BOLUS (SEPSIS)
1000.0000 mL | Freq: Once | INTRAVENOUS | Status: AC
  Administered 2017-11-16: 1000 mL via INTRAVENOUS

## 2017-11-16 MED ORDER — PANTOPRAZOLE SODIUM 20 MG PO TBEC: 20.0000 mg | DELAYED_RELEASE_TABLET | Freq: Every day | ORAL | 0 refills | Status: DC

## 2017-11-16 NOTE — ED Provider Notes (Signed)
Pt signed out to me a shift change. Pt with hx of alcohol abuse, here with abdominal pain and alcohol intoxication. Labs consistent with mild alcoholic ketosis. Pt reassessed by me, reports pain in epigastric area and nausea. Will give more fluids, try gi cocktail, and ativan for withdraws. Pt is shaky and restless.   5:22 AM Patient feeling much better.  He was able to drink 3 cups of water.  He is awake and alert.  He states that his stomach feels better as well.  Hemoglobin is stable. Abdomen minimally tender in epigastric area, no guarding, no peritoneal signs. Discussed alcohol cut down. VS normal at this time. No longer tachycardic. Plan to dc home with close outpatient follow up.    Vitals:   11/16/17 0420 11/16/17 0430 11/16/17 0433 11/16/17 0445  BP: (!) 140/94 (!) 130/97  136/79  Pulse: (!) 104 (!) 107 (!) 103 91  Resp:  (!) 23 (!) 22 18  Temp:   100 F (37.8 C)   TempSrc:   Oral   SpO2:  97% 97% 94%  Weight:      Height:          Jaynie CrumbleKirichenko, Margaree Sandhu, PA-C 11/16/17 0525    Abelino DerrickMackuen, Courteney Lyn, MD 11/16/17 [phone]

## 2017-11-16 NOTE — Discharge Instructions (Addendum)
Cut down on alcohol. Pepcid and carafate for stomach pain. Take maalox in addition for pain relief. Follow up with family doctor.

## 2018-01-10 ENCOUNTER — Other Ambulatory Visit: Payer: Self-pay

## 2018-01-10 ENCOUNTER — Encounter (HOSPITAL_COMMUNITY): Payer: Self-pay | Admitting: Emergency Medicine

## 2018-01-10 ENCOUNTER — Emergency Department (HOSPITAL_COMMUNITY)
Admission: EM | Admit: 2018-01-10 | Discharge: 2018-01-10 | Disposition: A | Discharge status: Home / Self Care | Payer: Self-pay | Attending: Emergency Medicine | Admitting: Emergency Medicine

## 2018-01-10 DIAGNOSIS — Z79899 Other long term (current) drug therapy: Secondary | ICD-10-CM | POA: Insufficient documentation

## 2018-01-10 DIAGNOSIS — F17210 Nicotine dependence, cigarettes, uncomplicated: Secondary | ICD-10-CM | POA: Insufficient documentation

## 2018-01-10 DIAGNOSIS — F1010 Alcohol abuse, uncomplicated: Secondary | ICD-10-CM

## 2018-01-10 DIAGNOSIS — F10129 Alcohol abuse with intoxication, unspecified: Secondary | ICD-10-CM | POA: Insufficient documentation

## 2018-01-10 MED ORDER — ADULT MULTIVITAMIN W/MINERALS CH
1.0000 | ORAL_TABLET | Freq: Once | ORAL | Status: AC
  Administered 2018-01-10: 1 via ORAL
  Filled 2018-01-10: qty 1

## 2018-01-10 MED ORDER — THERA VITAL M PO TABS: 1.0000 | ORAL_TABLET | Freq: Every day | ORAL | 0 refills | Status: DC

## 2018-01-10 NOTE — ED Triage Notes (Signed)
Pt BIB EMS from a convenience store. Called out by patient for ETOH use. Patient states he's been drinking vodka and beer for 3 days and would like help getting sober. Patient ambulatory with steady gait and is in NAD.

## 2018-01-10 NOTE — ED Provider Notes (Addendum)
[facility]-EMERGENCY DEPT Provider Note   CSN: [id number] Arrival date & time: 01/10/18  2004     History   Chief Complaint Chief Complaint  Patient presents with  . Alcohol Intoxication    HPI Willie Boyle is a 47 y.o. male with a PMHx of alcoholism and gastritis, who presents to the ED via EMS with complaints of alcoholism which is a chronic issue.  Of note, pt has been seen multiple times recently for alcohol abuse and chronic abdominal pain.  Patient states that he drinks "a lot of beer" everyday and wants to stop.  He is aware that this is a chronic problem and wants to stop drinking, wants help with this.  He denies SI, HI, AVH, illicit drug use, or tobacco use.  He is chronic epigastric abdominal pain from gastritis but denies that there is anything new or different today.  He states he hasn't been eating because he spends all his time drinking alcohol, and is requesting something to help him stop.  He is wanting a vitamin to help him.  He denies fevers, chills, CP, SOB, new/worsening abd pain, N/V/D/C, hematuria, dysuria, myalgias, arthralgias, numbness, tingling, focal weakness, or any other complaints at this time.   The history is provided by the patient and medical records. A language interpreter was used (provider).  Alcohol Intoxication  Pertinent negatives include no chest pain, no abdominal pain and no shortness of breath.    Past Medical History:  Diagnosis Date  . Alcohol abuse     Patient Active Problem List   Diagnosis Date Noted  . Alcohol abuse 11/09/2013  . Alcohol withdrawal (HCC) 11/09/2013    History reviewed. No pertinent surgical history.      Home Medications    Prior to Admission medications   Medication Sig Start Date End Date Taking? Authorizing Provider  famotidine (PEPCID) 20 MG tablet Take 1 tablet (20 mg total) by mouth 2 (two) times daily. Patient not taking: Reported on 01/10/2018 11/16/17   Jaynie Crumble,  PA-C  pantoprazole (PROTONIX) 20 MG tablet Take 1 tablet (20 mg total) by mouth daily. Patient not taking: Reported on 01/10/2018 11/16/17   Jaynie Crumble, PA-C    Family History History reviewed. No pertinent family history.  Social History Social History   Tobacco Use  . Smoking status: Current Every Day Smoker  . Smokeless tobacco: Never Used  Substance Use Topics  . Alcohol use: Yes  . Drug use: No     Allergies   Patient has no known allergies.   Review of Systems Review of Systems  Constitutional: Negative for chills and fever.  Respiratory: Negative for shortness of breath.   Cardiovascular: Negative for chest pain.  Gastrointestinal: Negative for abdominal pain, constipation, diarrhea, nausea and vomiting.  Genitourinary: Negative for dysuria and hematuria.  Musculoskeletal: Negative for arthralgias and myalgias.  Skin: Negative for color change.  Allergic/Immunologic: Negative for immunocompromised state.  Neurological: Negative for weakness and numbness.  Psychiatric/Behavioral: Negative for confusion, hallucinations and suicidal ideas.   All other systems reviewed and are negative for acute change except as noted in the HPI.    Physical Exam Updated Vital Signs BP (!) 143/94 (BP Location: Right Arm)   Pulse (!) 102   Temp 98.6 F (37 C) (Oral)   Resp 20   SpO2 94%   Physical Exam  Constitutional: He is oriented to person, place, and time. Vital signs are normal. He appears well-developed and well-nourished.  Non-toxic appearance. No distress.  Afebrile, nontoxic, NAD  HENT:  Head: Normocephalic and atraumatic.  Mouth/Throat: Oropharynx is clear and moist and mucous membranes are normal.  Eyes: Conjunctivae and EOM are normal. Right eye exhibits no discharge. Left eye exhibits no discharge.  Neck: Normal range of motion. Neck supple.  Cardiovascular: Normal rate, regular rhythm, normal heart sounds and intact distal pulses. Exam reveals no gallop  and no friction rub.  No murmur heard. No tachycardia on exam  Pulmonary/Chest: Effort normal and breath sounds normal. No respiratory distress. He has no decreased breath sounds. He has no wheezes. He has no rhonchi. He has no rales.  Abdominal: Soft. Normal appearance and bowel sounds are normal. He exhibits no distension. There is no tenderness. There is no rigidity, no rebound, no guarding, no CVA tenderness, no tenderness at McBurney's point and negative Murphy's sign.  Soft, NTND, +BS throughout, no r/g/r, neg murphy's, neg mcburney's, no CVA TTP   Musculoskeletal: Normal range of motion.  Neurological: He is alert and oriented to person, place, and time. He has normal strength. No sensory deficit. GCS eye subscore is 4. GCS verbal subscore is 5. GCS motor subscore is 6.  A&O x4 GCS 15 Sensation and strength intact in all extremities Gait steady and nonataxic and nonantalgic  Skin: Skin is warm, dry and intact. No rash noted.  Psychiatric: He has a normal mood and affect. He is not actively hallucinating. He expresses no homicidal and no suicidal ideation. He expresses no suicidal plans and no homicidal plans.  Normal mood and affect, pleasant and cooperative. Denies SI, HI, or AVH, doesn't seem to be responding to internal stimuli.   Nursing note and vitals reviewed.    ED Treatments / Results  Labs (all labs ordered are listed, but only abnormal results are displayed) Labs Reviewed - No data to display  EKG None  Radiology No results found.  Procedures Procedures (including critical care time)  Medications Ordered in ED Medications  multivitamin with minerals tablet 1 tablet (1 tablet Oral Given 01/10/18 2108)     Initial Impression / Assessment and Plan / ED Course  I have reviewed the triage vital signs and the nursing notes.  Pertinent labs & imaging results that were available during my care of the patient were reviewed by me and considered in my medical decision  making (see chart for details).     47 y.o. male here with alcoholism which is a chronic problem. States he drinks a lot of beer and wants to stop. Denies SI/HI/AVH, no drug use or tobacco use. No other medical complaints at this time, states he wants to get a medication to help him stop drinking. On exam, no abdominal tenderness, gait steady and nonantalgic, pt A&O x3 and in NAD. There does not appear to be an emergent medical condition that requires further emergent work up at this time. Doubt need for TTS consultation given lack of psychiatric complaints. Pt requesting a vitamin to help him, will give multivitamin and rx for this. Advised adequate hydration and rest, adequate food consumption, and f/up with list of resources given to find help with alcoholism. I explained the diagnosis and have given explicit precautions to return to the ER including for any other new or worsening symptoms. The patient understands and accepts the medical plan as it's been dictated and I have answered their questions. Discharge instructions concerning home care and prescriptions have been given. The patient is STABLE and is discharged to home in good condition.  Final Clinical Impressions(s) / ED Diagnoses   Final diagnoses:  Alcohol abuse    ED Discharge Orders        Ordered    Multiple Vitamins-Minerals (MULTIVITAMIN) tablet  Daily     01/10/18 [address], [place], [place] 01/10/18 2113    Tilden Fossa, MD 01/13/18 1235

## 2018-01-10 NOTE — ED Notes (Signed)
PA at bedside.

## 2018-01-10 NOTE — ED Notes (Signed)
Bed: [facility] Expected date:  Expected time:  Means of arrival:  Comments: etoh

## 2018-01-10 NOTE — Discharge Instructions (Signed)
STOP DRINKING ALCOHOL! Take the vitamin prescribed to you as directed. Stay well hydrated, get plenty of rest, and eat well balanced meals. Use the list of resources below to find help with your alcoholism. Return to the ER for emergent changes or worsening symptoms.

## 2018-01-12 ENCOUNTER — Emergency Department (HOSPITAL_COMMUNITY): Payer: Self-pay

## 2018-01-12 ENCOUNTER — Other Ambulatory Visit: Payer: Self-pay

## 2018-01-12 ENCOUNTER — Emergency Department (HOSPITAL_COMMUNITY)
Admission: EM | Admit: 2018-01-12 | Discharge: 2018-01-12 | Disposition: A | Discharge status: Home / Self Care | Payer: Self-pay | Attending: Emergency Medicine | Admitting: Emergency Medicine

## 2018-01-12 ENCOUNTER — Encounter (HOSPITAL_COMMUNITY): Payer: Self-pay | Admitting: Emergency Medicine

## 2018-01-12 DIAGNOSIS — F17200 Nicotine dependence, unspecified, uncomplicated: Secondary | ICD-10-CM | POA: Insufficient documentation

## 2018-01-12 DIAGNOSIS — R1011 Right upper quadrant pain: Secondary | ICD-10-CM

## 2018-01-12 DIAGNOSIS — Z79899 Other long term (current) drug therapy: Secondary | ICD-10-CM | POA: Insufficient documentation

## 2018-01-12 DIAGNOSIS — R112 Nausea with vomiting, unspecified: Secondary | ICD-10-CM | POA: Insufficient documentation

## 2018-01-12 DIAGNOSIS — K2920 Alcoholic gastritis without bleeding: Secondary | ICD-10-CM | POA: Insufficient documentation

## 2018-01-12 DIAGNOSIS — R5383 Other fatigue: Secondary | ICD-10-CM | POA: Insufficient documentation

## 2018-01-12 DIAGNOSIS — R5381 Other malaise: Secondary | ICD-10-CM | POA: Insufficient documentation

## 2018-01-12 DIAGNOSIS — F1010 Alcohol abuse, uncomplicated: Secondary | ICD-10-CM | POA: Insufficient documentation

## 2018-01-12 LAB — URINALYSIS, ROUTINE W REFLEX MICROSCOPIC
BILIRUBIN URINE: NEGATIVE
Glucose, UA: NEGATIVE mg/dL
HGB URINE DIPSTICK: NEGATIVE
Ketones, ur: NEGATIVE mg/dL
Leukocytes, UA: NEGATIVE
NITRITE: NEGATIVE
PH: 7 (ref 5.0–8.0)
Protein, ur: NEGATIVE mg/dL
Specific Gravity, Urine: 1.018 (ref 1.005–1.030)

## 2018-01-12 LAB — COMPREHENSIVE METABOLIC PANEL
ALBUMIN: 3.8 g/dL (ref 3.5–5.0)
ALT: 25 U/L (ref 17–63)
AST: 40 U/L (ref 15–41)
Alkaline Phosphatase: 62 U/L (ref 38–126)
Anion gap: 16 — ABNORMAL HIGH (ref 5–15)
BUN: 16 mg/dL (ref 6–20)
CHLORIDE: 103 mmol/L (ref 101–111)
CO2: 20 mmol/L — AB (ref 22–32)
CREATININE: 0.95 mg/dL (ref 0.61–1.24)
Calcium: 8.4 mg/dL — ABNORMAL LOW (ref 8.9–10.3)
GFR calc Af Amer: >60 mL/min (ref >60)
GFR calc non Af Amer: >60 mL/min (ref >60)
Glucose, Bld: 115 mg/dL — ABNORMAL HIGH (ref 65–99)
POTASSIUM: 3.7 mmol/L (ref 3.5–5.1)
SODIUM: 139 mmol/L (ref 135–145)
Total Bilirubin: 1 mg/dL (ref 0.3–1.2)
Total Protein: 6.9 g/dL (ref 6.5–8.1)

## 2018-01-12 LAB — POC OCCULT BLOOD, ED: FECAL OCCULT BLD: NEGATIVE

## 2018-01-12 LAB — CBC
HEMATOCRIT: 47.8 % (ref 39.0–52.0)
HEMOGLOBIN: 16.5 g/dL (ref 13.0–17.0)
MCH: 28.4 pg (ref 26.0–34.0)
MCHC: 34.5 g/dL (ref 30.0–36.0)
MCV: 82.4 fL (ref 78.0–100.0)
Platelets: 174 10*3/uL (ref 150–400)
RBC: 5.8 MIL/uL (ref 4.22–5.81)
RDW: 14 % (ref 11.5–15.5)
WBC: 5.9 10*3/uL (ref 4.0–10.5)

## 2018-01-12 LAB — RAPID URINE DRUG SCREEN, HOSP PERFORMED
Amphetamines: NOT DETECTED
BARBITURATES: NOT DETECTED
Benzodiazepines: NOT DETECTED
Cocaine: NOT DETECTED
Opiates: NOT DETECTED
TETRAHYDROCANNABINOL: NOT DETECTED

## 2018-01-12 LAB — ABO/RH: ABO/RH(D): O POS

## 2018-01-12 LAB — PROTIME-INR
INR: 1.01
Prothrombin Time: 13.2 seconds (ref 11.4–15.2)

## 2018-01-12 LAB — TYPE AND SCREEN
ABO/RH(D): O POS
ANTIBODY SCREEN: NEGATIVE

## 2018-01-12 LAB — ETHANOL: Alcohol, Ethyl (B): 208 mg/dL — ABNORMAL HIGH (ref <10)

## 2018-01-12 LAB — ACETAMINOPHEN LEVEL

## 2018-01-12 LAB — LIPASE, BLOOD: LIPASE: 29 U/L (ref 11–51)

## 2018-01-12 MED ORDER — PANTOPRAZOLE SODIUM 40 MG PO TBEC
40.0000 mg | DELAYED_RELEASE_TABLET | Freq: Every day | ORAL | Status: DC
  Administered 2018-01-12: 40 mg via ORAL
  Filled 2018-01-12: qty 1

## 2018-01-12 MED ORDER — VITAMIN B-1 100 MG PO TABS
100.0000 mg | ORAL_TABLET | Freq: Once | ORAL | Status: AC
  Administered 2018-01-12: 100 mg via ORAL
  Filled 2018-01-12: qty 1

## 2018-01-12 MED ORDER — ONDANSETRON HCL 4 MG/2ML IJ SOLN
4.0000 mg | Freq: Once | INTRAMUSCULAR | Status: AC
  Administered 2018-01-12: 4 mg via INTRAVENOUS
  Filled 2018-01-12: qty 2

## 2018-01-12 MED ORDER — OMEPRAZOLE 20 MG PO CPDR: 20.0000 mg | DELAYED_RELEASE_CAPSULE | Freq: Every day | ORAL | 0 refills | Status: AC

## 2018-01-12 MED ORDER — SODIUM CHLORIDE 0.9 % IV BOLUS
1000.0000 mL | Freq: Once | INTRAVENOUS | Status: AC
  Administered 2018-01-12: 1000 mL via INTRAVENOUS

## 2018-01-12 NOTE — ED Notes (Addendum)
Patient stating that he would like to leave if he cannot get food and water. Per order by Laurell Josephs, this RN gave patient food and water.

## 2018-01-12 NOTE — Discharge Instructions (Addendum)
Labs looked ok today.   Ultrasound of abdomen shows mild changes to your liver most likely from alcohol use.   Establish care with a primary care doctor for further management of this.   See resources for substance abuse resources.   Take omeprazole daily.   Anlisis de sangre son normales hoy.   El ultrasonido del abdomen muestra cambios leves en el hgado ms propensos al consumo de alcohol.   Establece cuidado con un mdico de atencin primaria para mantenimiento y re-evaluacion de esto.   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

## 2018-01-12 NOTE — ED Notes (Signed)
Bed: [facility] Expected date:  Expected time:  Means of arrival:  Comments: No bed 

## 2018-01-12 NOTE — ED Notes (Signed)
Bed: [facility] Expected date:  Expected time:  Means of arrival:  Comments: HOLD FOR ROOM 12

## 2018-01-12 NOTE — ED Notes (Signed)
Spoke with [place] tech. [place] stated that they will be experiencing delays in exams this morning due to limited equipment. Patient has been made aware and verbalizes understanding.

## 2018-01-12 NOTE — ED Triage Notes (Signed)
[place] by [place] on [facility]  Pt states he has been drinking for 10 days straight  Pt is asking for water  Pt is ambulatory

## 2018-01-12 NOTE — ED Provider Notes (Signed)
[facility]-EMERGENCY DEPT Provider Note   CSN: [id number] Arrival date & time: 01/12/18  4098     History   Chief Complaint Chief Complaint  Patient presents with  . Alcohol Intoxication    HPI Willie Boyle is a 47 y.o. male with history of EtOH abuse, gastritis, called EMS for "I feel like i'm going to die".  Clarifies that he just does not feel well. Has generalized malaise, nausea, right upper quadrant abdominal pain, generalized fatigue.  He denies suicidal ideation, depressed mood, HI, AVHs. States he has been drinking beer and liquor for the last 10 days nonstop.  Has noticed black stools for the last 2 days, bright red blood emesis since yesterday.  Has not eaten or drink fluids.  States that he has had blood in his stool before and was in the ER for it but was not told what it was.  Does admit that alcohol is a chronic problem. Denies illicit drug use or tobacco use.   No fevers, chills, cough, hematuria, dysuria. No anticoagulants.   HPI  Past Medical History:  Diagnosis Date  . Alcohol abuse     Patient Active Problem List   Diagnosis Date Noted  . Alcohol abuse 11/09/2013  . Alcohol withdrawal (HCC) 11/09/2013    History reviewed. No pertinent surgical history.      Home Medications    Prior to Admission medications   Medication Sig Start Date End Date Taking? Authorizing Provider  omeprazole (PRILOSEC) 20 MG capsule Take 1 capsule (20 mg total) by mouth daily. 01/12/18   Liberty Handy, PA-C    Family History History reviewed. No pertinent family history.  Social History Social History   Tobacco Use  . Smoking status: Current Every Day Smoker  . Smokeless tobacco: Never Used  Substance Use Topics  . Alcohol use: Yes  . Drug use: No     Allergies   Patient has no known allergies.   Review of Systems Review of Systems  Constitutional: Positive for fatigue.  Gastrointestinal: Positive for abdominal pain, blood in  stool, nausea and vomiting (bloody).  All other systems reviewed and are negative.    Physical Exam Updated Vital Signs BP (!) 158/102   Pulse 82   Temp 98.2 F (36.8 C) (Oral)   Resp 16   SpO2 96%   Physical Exam  Constitutional: He is oriented to person, place, and time. He appears well-developed and well-nourished. No distress.  Sitting up on hall bed.  Nontoxic.  In no acute distress.  HENT:  Head: Normocephalic.  Nose: Nose normal.  No intraoral or tongue injury. Moist mucous membranes.  Atraumatic.  Eyes: Conjunctivae are normal.  PERRL and EOMs intact bilaterally.  Neck: Normal range of motion.  Cardiovascular: Regular rhythm, S1 normal, S2 normal and intact distal pulses. Tachycardia present.  2+ DP and radial pulses bilaterally. No LE edema.   Pulmonary/Chest: Effort normal and breath sounds normal.  Abdominal: Soft. Bowel sounds are normal. There is tenderness in the right upper quadrant.  No obvious distention.  Soft.  No hepatomegaly.  No G/R/R. No suprapubic or CVA tenderness. Negative Murphy's and McBurney's  Musculoskeletal: Normal range of motion.  Neurological: He is alert and oriented to person, place, and time.  Perl and EOMs intact bilaterally.  Cranial nerves intact.  5/5 strength with handgrip and ankle dorsiflexion and plantarflexion.  Sensation to light touch intact in hands and feet.  No truncal sway.  Skin: Skin is warm and dry.  Capillary refill takes less than 2 seconds.  Psychiatric: He has a normal mood and affect. His behavior is normal. Judgment and thought content normal.  Nursing note and vitals reviewed.    ED Treatments / Results  Labs (all labs ordered are listed, but only abnormal results are displayed) Labs Reviewed  COMPREHENSIVE METABOLIC PANEL - Abnormal; Notable for the following components:      Result Value   CO2 20 (*)    Glucose, Bld 115 (*)    Calcium 8.4 (*)    Anion gap 16 (*)    All other components within normal limits   ETHANOL - Abnormal; Notable for the following components:   Alcohol, Ethyl (B) 208 (*)    All other components within normal limits  ACETAMINOPHEN LEVEL - Abnormal; Notable for the following components:   Acetaminophen (Tylenol), Serum <10 (*)    All other components within normal limits  CBC  RAPID URINE DRUG SCREEN, HOSP PERFORMED  LIPASE, BLOOD  PROTIME-INR  URINALYSIS, ROUTINE W REFLEX MICROSCOPIC  HEPATITIS PANEL, ACUTE  POC OCCULT BLOOD, ED  TYPE AND SCREEN  ABO/RH    EKG None  Radiology US Abdomen Limited Ruq  Result Date: 01/12/2018 CLINICAL DATA:  Right upper quadrant pain. EXAM: ULTRASOUND ABDOMEN LIMITED RIGHT UPPER QUADRANT COMPARISON:  None. FINDINGS: Gallbladder: No gallstones or wall thickening visualized. No sonographic Murphy sign noted by sonographer. Common bile duct: Diameter: 6 mm, normal. Liver: No focal lesion identified. Diffusely increased in parenchymal echogenicity. Portal vein is patent on color Doppler imaging with normal direction of blood flow towards the liver. IMPRESSION: 1. No acute abnormality. 2. Hepatic steatosis. Electronically Signed   By: Obie Dredge M.D.   On: 01/12/2018 11:22    Procedures Procedures (including critical care time)  Medications Ordered in ED Medications  sodium chloride 0.9 % bolus 1,000 mL (0 mLs Intravenous Stopped 01/12/18 1059)  thiamine (VITAMIN B-1) tablet 100 mg (100 mg Oral Given 01/12/18 0934)  ondansetron (ZOFRAN) injection 4 mg (4 mg Intravenous Given 01/12/18 0933)     Initial Impression / Assessment and Plan / ED Course  I have reviewed the triage vital signs and the nursing notes.  Pertinent labs & imaging results that were available during my care of the patient were reviewed by me and considered in my medical decision making (see chart for details).  Clinical Course as of Jan 13 2016  Tue Jan 12, 2018  0947 Alcohol, Ethyl (B)(!): 208 [CG]  1010 Anion gap(!): 16 [CG]  1011 Glucose(!): 115 [CG]     Clinical Course User Index [CG] Liberty Handy, PA-C   47 year old with ongoing heavy EtOH use here for generalized malaise.  Associated with nausea, hematemesis, right upper quadrant abdominal pain, melena.  He does have history of gastritis without hemorrhage.    Exam remarkable for tachycardia.  He has mild, right upper quadrant tenderness.  Negative Murphy's.  No peritonitis.  Differential diagnosis includes gastritis with hemorrhage, other GI bleed, cholecystitis, hepatitis, PUD.  1010: Labs reviewed.  Glucose 115, anion gap 16.  EtOH 208.  Otherwise unremarkable.  Hemoccult negative.  Hemoglobin normal.  Patient given thiamine, Zofran, Protonix and IV fluids.  Repeat abdominal exam with mild but persistent RUQ tenderness, RUQ [place] pending.  Tachycardia resolved.  Final Clinical Impressions(s) / ED Diagnoses   RUQ  US shows hepatic steatosis.  Pt ambulatory in ER tolerating PO. No episodes of emesis or diarrhea in ER. HD stable.  Will dc with protonix.  Counseled  patient on ETOH cessation. No plan to stop ETOH at this moment. Will defer librium. Discussed return precautions.  Final diagnoses:  RUQ abdominal pain  Alcohol abuse  Chronic alcoholic gastritis without hemorrhage    ED Discharge Orders        Ordered    omeprazole (PRILOSEC) 20 MG capsule  Daily     01/12/18 1146       Jerrell Mylar 01/12/18 2017    Linwood Dibbles, MD 01/13/18 1815

## 2018-01-12 NOTE — ED Notes (Signed)
Interpreter [phone] used for communication with patient to obtain IV, give meds, and obtain POC occult blood.

## 2018-01-13 LAB — HEPATITIS PANEL, ACUTE
HCV Ab: <0.1 s/co ratio (ref 0.0–0.9)
HEP B C IGM: NEGATIVE
Hep A IgM: NEGATIVE
Hepatitis B Surface Ag: NEGATIVE

## 2018-10-24 IMAGING — US US ABDOMEN LIMITED
1 series · 14 of 25 positions shown · non-contrast
Comparison: None.

CLINICAL DATA: Right upper quadrant pain.

EXAM:
ULTRASOUND ABDOMEN LIMITED RIGHT UPPER QUADRANT

[Series 1: us abdomen limited · 0.21mm/px · 14 of 59 slices shown]
[im 1/59]
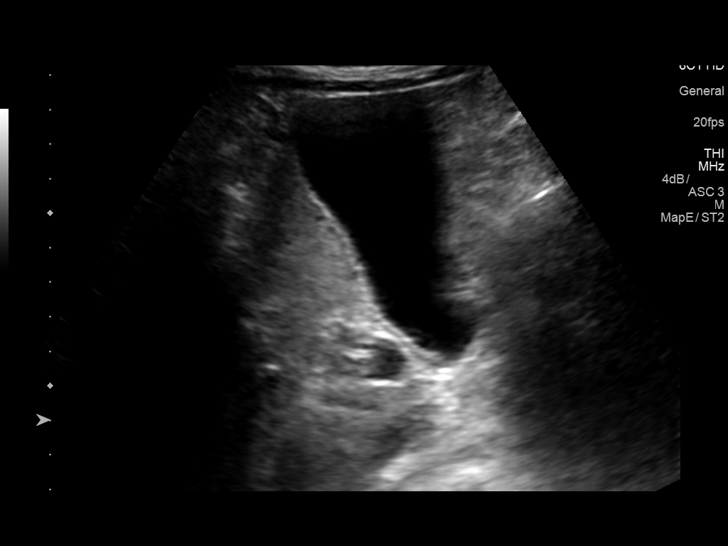
[im 5/59]
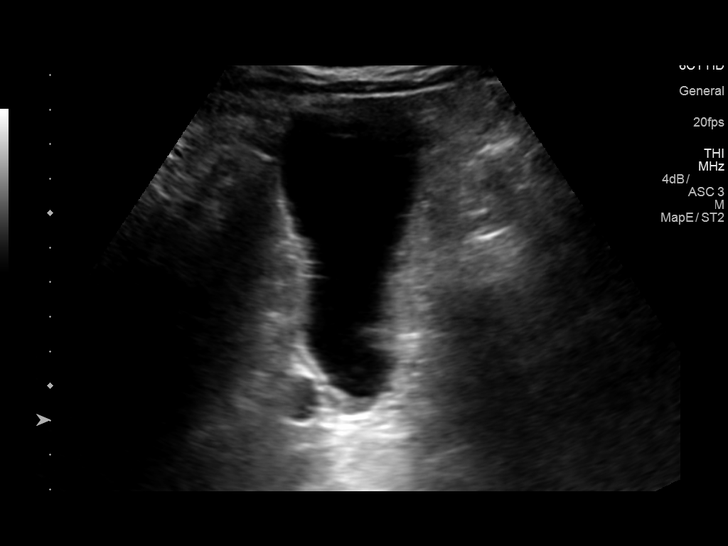
[im 10/59]
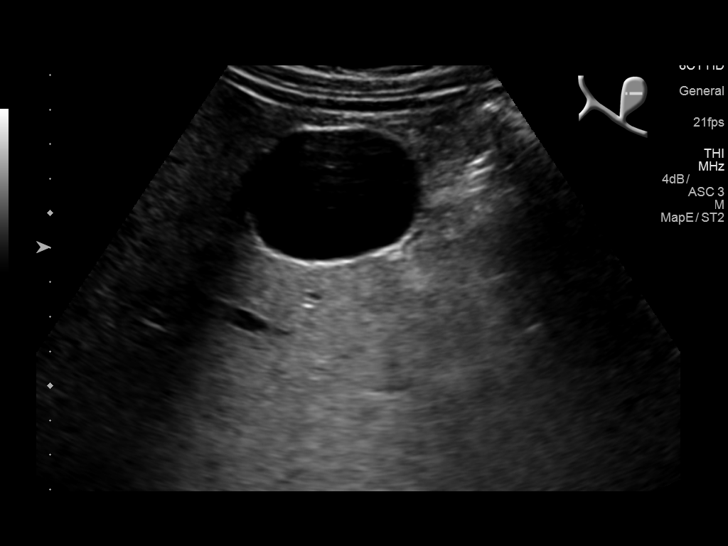
[im 15/59]
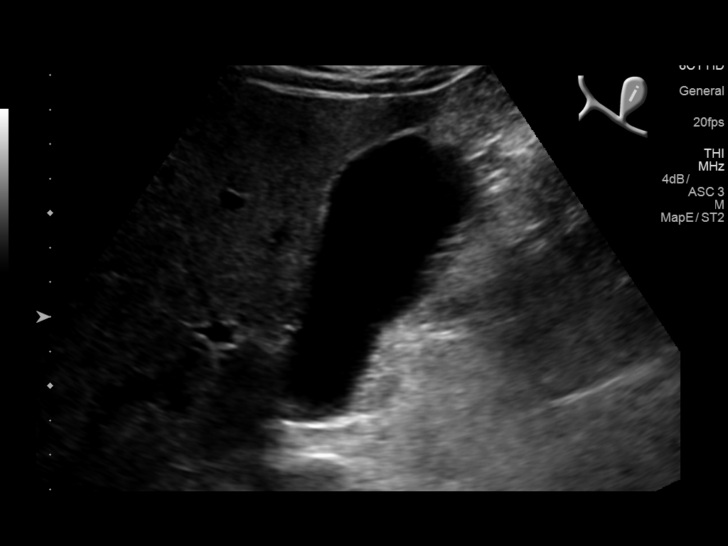
[im 20/59]
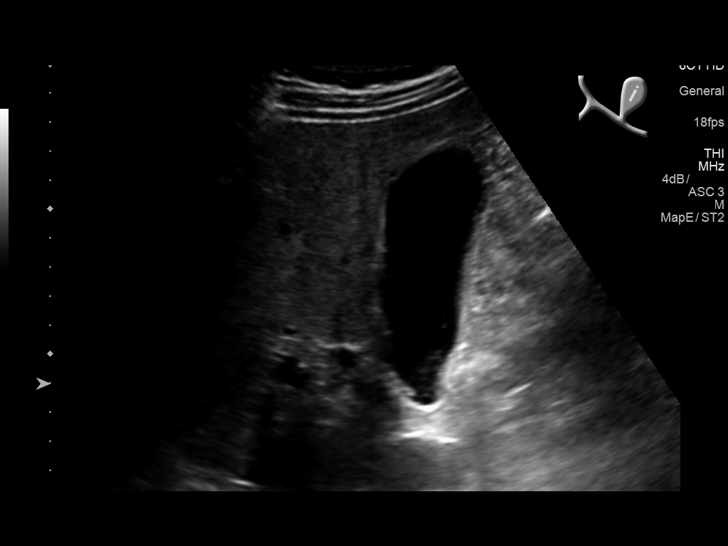
[im 22/59]
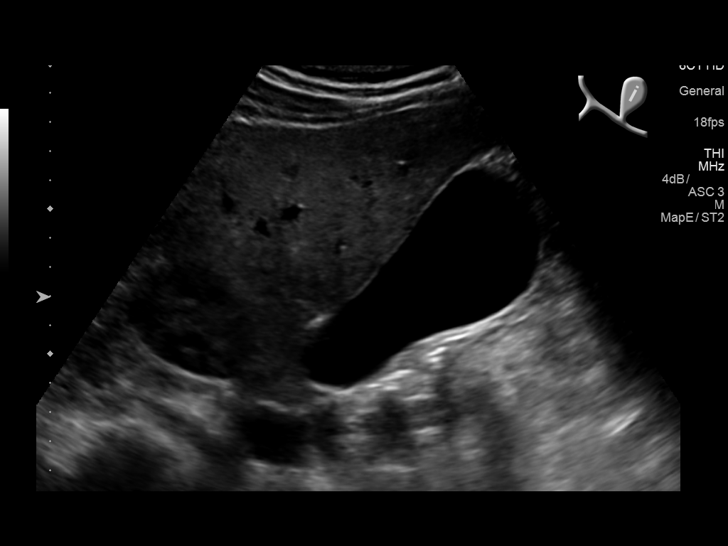
[im 27/59]
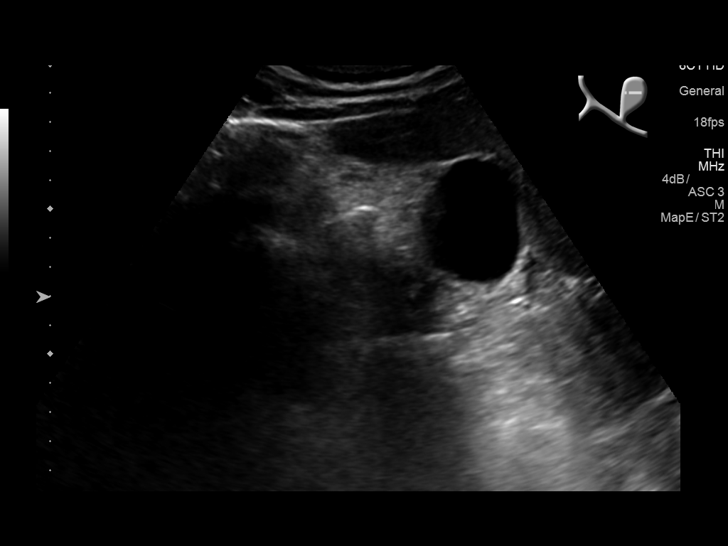
[im 32/59]
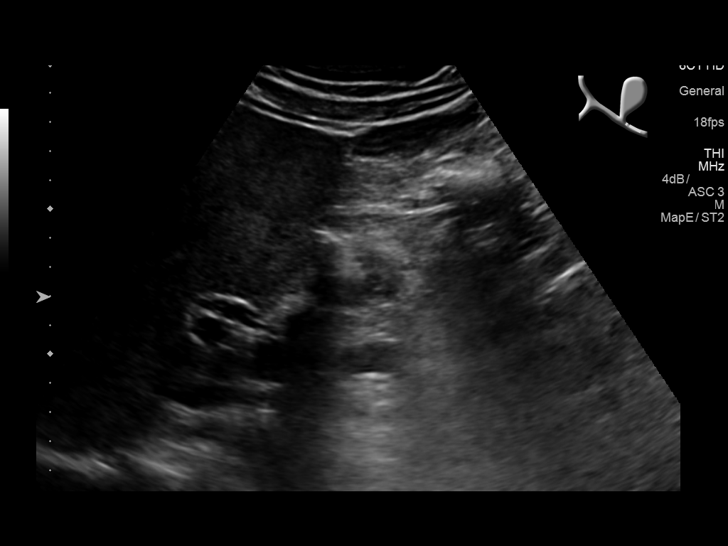
[im 37/59]
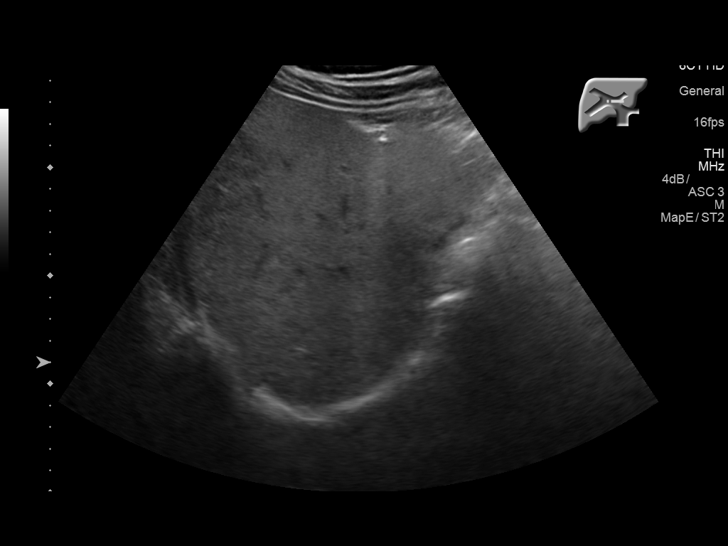
[im 39/59]
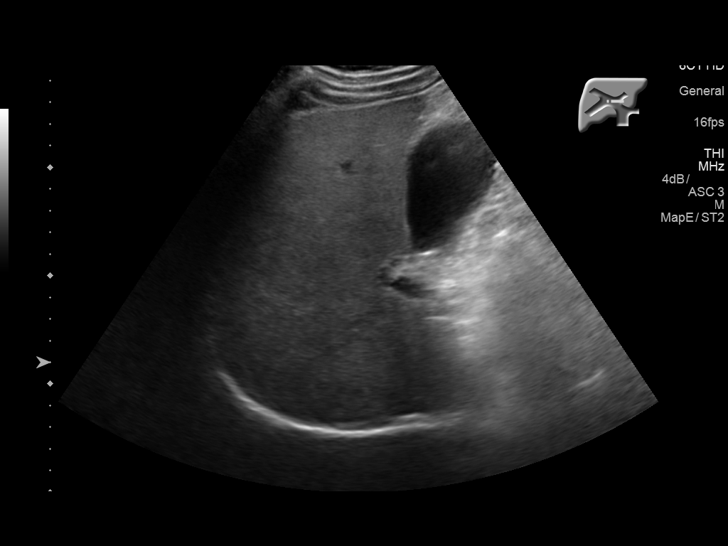
[im 44/59]
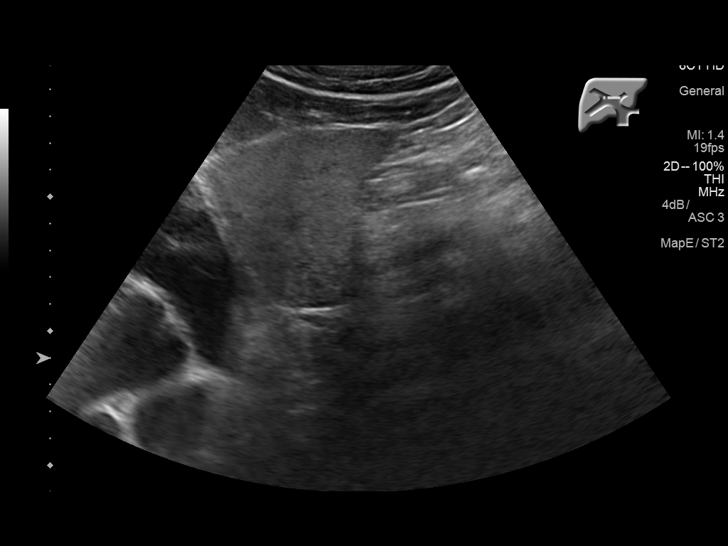
[im 49/59]
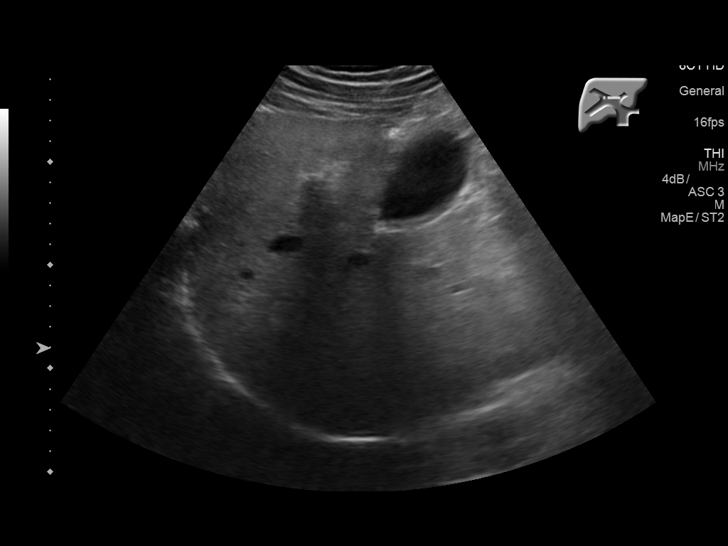
[im 54/59]
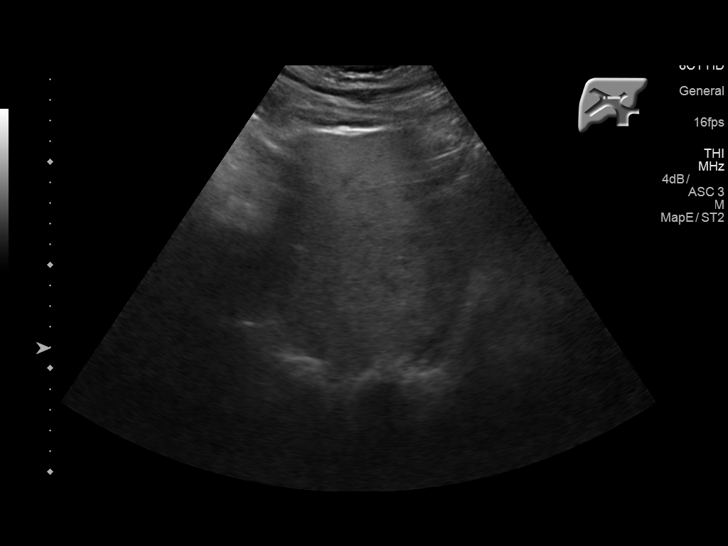
[im 59/59]
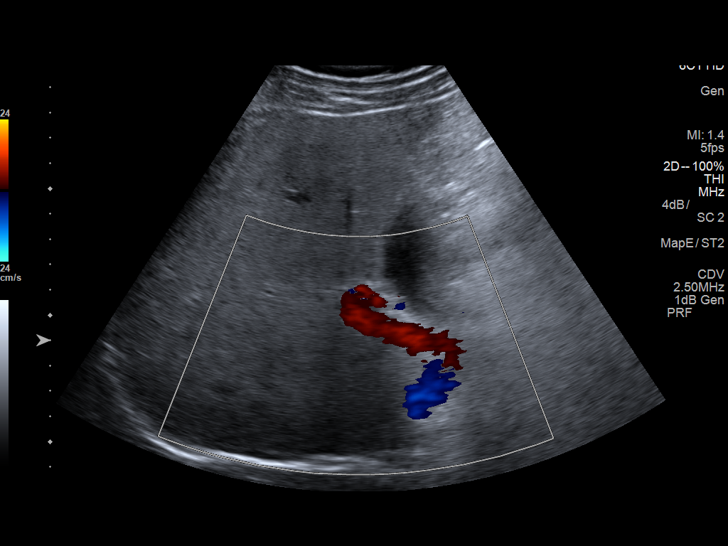

[14 of 25 positions shown; findings below may reference images not displayed]

FINDINGS: Gallbladder:

No gallstones or wall thickening visualized. No sonographic Murphy
sign noted by sonographer.

Common bile duct:

Diameter: 6 mm, normal.

Liver:

No focal lesion identified. Diffusely increased in parenchymal
echogenicity. Portal vein is patent on color Doppler imaging with
normal direction of blood flow towards the liver.
IMPRESSION: 1. No acute abnormality.
2. Hepatic steatosis.
